# Patient Record
Sex: Female | Born: 1970 | Race: Black or African American | Hispanic: No | Marital: Single | Smoking: Never smoker
Health system: Southern US, Community
[De-identification: ages and names within clinical notes are randomized; demographics above are authoritative.]

## PROBLEM LIST (undated history)

## (undated) DIAGNOSIS — I10 Essential (primary) hypertension: Secondary | ICD-10-CM

## (undated) DIAGNOSIS — R112 Nausea with vomiting, unspecified: Secondary | ICD-10-CM

## (undated) DIAGNOSIS — R55 Syncope and collapse: Principal | ICD-10-CM

## (undated) DIAGNOSIS — E119 Type 2 diabetes mellitus without complications: Secondary | ICD-10-CM

## (undated) DIAGNOSIS — E559 Vitamin D deficiency, unspecified: Secondary | ICD-10-CM

## (undated) DIAGNOSIS — D649 Anemia, unspecified: Secondary | ICD-10-CM

## (undated) DIAGNOSIS — Z9889 Other specified postprocedural states: Secondary | ICD-10-CM

## (undated) DIAGNOSIS — M7989 Other specified soft tissue disorders: Secondary | ICD-10-CM

## (undated) DIAGNOSIS — I493 Ventricular premature depolarization: Secondary | ICD-10-CM

## (undated) HISTORY — PX: TUBAL LIGATION: SHX77

## (undated) HISTORY — DX: Ventricular premature depolarization: I49.3

## (undated) HISTORY — DX: Syncope and collapse: R55

## (undated) HISTORY — DX: Essential (primary) hypertension: I10

## (undated) HISTORY — PX: WISDOM TOOTH EXTRACTION: SHX21

---

## 2002-03-03 ENCOUNTER — Other Ambulatory Visit: Admission: RE | Admit: 2002-03-03 | Discharge: 2002-03-03 | Payer: Self-pay

## 2003-07-04 ENCOUNTER — Other Ambulatory Visit: Admission: RE | Admit: 2003-07-04 | Discharge: 2003-07-04 | Payer: Self-pay | Admitting: Family Medicine

## 2004-11-20 ENCOUNTER — Emergency Department (HOSPITAL_COMMUNITY): Admission: EM | Admit: 2004-11-20 | Discharge: 2004-11-20 | Payer: Self-pay | Admitting: Emergency Medicine

## 2004-12-27 ENCOUNTER — Other Ambulatory Visit: Admission: RE | Admit: 2004-12-27 | Discharge: 2004-12-27 | Payer: Self-pay | Admitting: Obstetrics and Gynecology

## 2006-07-25 ENCOUNTER — Ambulatory Visit (HOSPITAL_COMMUNITY): Admission: RE | Admit: 2006-07-25 | Discharge: 2006-07-25 | Payer: Self-pay | Admitting: Obstetrics and Gynecology

## 2007-05-05 ENCOUNTER — Emergency Department (HOSPITAL_COMMUNITY): Admission: EM | Admit: 2007-05-05 | Discharge: 2007-05-05 | Payer: Self-pay | Admitting: Emergency Medicine

## 2010-08-17 ENCOUNTER — Other Ambulatory Visit (HOSPITAL_COMMUNITY)
Admission: RE | Admit: 2010-08-17 | Discharge: 2010-08-17 | Disposition: A | Discharge status: Home / Self Care | Payer: Managed Care, Other (non HMO) | Source: Ambulatory Visit | Attending: Family Medicine | Admitting: Family Medicine

## 2010-08-17 DIAGNOSIS — Z124 Encounter for screening for malignant neoplasm of cervix: Secondary | ICD-10-CM | POA: Insufficient documentation

## 2010-08-20 ENCOUNTER — Other Ambulatory Visit: Payer: Self-pay | Admitting: Family Medicine

## 2010-10-05 NOTE — H&P (Signed)
NAME:  Amy [name], Amy [name]                 ACCOUNT NO.:  [id number]   MEDICAL RECORD NO.:  [id number]          PATIENT TYPE:  AMB   LOCATION:  SDC                           FACILITY:  [facility]   PHYSICIAN:  Juluis Mire, M.D.   DATE OF BIRTH:  1970/08/05   DATE OF ADMISSION:  07/25/2006  DATE OF DISCHARGE:                              HISTORY & PHYSICAL   HISTORY OF PRESENT ILLNESS:  The patient is a 40 year old gravida 2,  para 1, abortus 1 female who presents for laparoscopic bilateral tubal  ligation.   The patient desires a permanent sterilization.  Potential  irreversibility of sterilization was discussed.  Alternative birth  control were explained.  Failure rate of 1 in 200 is quoted; failures  can be in the form of ectopic pregnancy, requiring further surgical  management.   ALLERGIES:  NO KNOWN DRUG ALLERGIES.   MEDICATIONS:  Include Allegra.   PAST MEDICAL HISTORY:  Usual childhood disease without any significant  sequelae.   PAST SURGICAL HISTORY:  No previous surgical history.   OBSTETRICAL HISTORY:  She has had 1 prior cesarean section and 1  miscarriage.   FAMILY HISTORY:  History of diabetes as well as hypertension.   SOCIAL HISTORY:  No tobacco or alcohol use.   REVIEW OF SYSTEMS:  Noncontributory.   PHYSICAL EXAM:  VITAL SIGNS:  The patient is afebrile with stable vital  signs.  HEENT:  The patient is normocephalic.  Pupils are equal, round and  reactive to light and accommodation.  Extraocular movements were intact.  Sclerae and conjunctivae were clear.  Oropharynx clear.  NECK:  Without thyromegaly.  BREASTS:  No discrete masses.  LUNGS:  Clear.  CARDIOVASCULAR:  Regular rhythm and rate without murmurs or gallops.  ABDOMEN:  Exam is benign.  No mass, organomegaly or tenderness.  PELVIC:  Normal external genitalia.  Vaginal mucosa is clear.  Cervix is  unremarkable.  Uterus is normal size, shape and contour.  Adnexa are  free of masses or tenderness.  EXTREMITIES:  Trace edema.  NEUROLOGIC:  Exam is grossly within normal limits.   IMPRESSION:  1. Multiparity.  2. Desires sterility.   PLAN:  The patient will undergo a laparoscopic bilateral tubal ligation.  The risks of surgery have been discussed including the risk of  infection, the risk of hemorrhage that could require transfusion with  the risk of AIDS or hepatitis, the risk of injury to adjacent organs  including bladder, bowel or ureters that could require further  exploratory surgery, the risks of deep venous thrombosis and pulmonary  emboli.  The patient expressed understanding of indications and risks.      Juluis Mire, M.D.  Electronically Signed     JSM/MEDQ  D:  07/25/2006  T:  07/25/2006  Job:  [id number]

## 2010-10-05 NOTE — Op Note (Signed)
Amy [name], Amy [name]                 ACCOUNT NO.:  [id number]   MEDICAL RECORD NO.:  [id number]          PATIENT TYPE:  AMB   LOCATION:  SDC                           FACILITY:  [facility]   PHYSICIAN:  Juluis Mire, M.D.   DATE OF BIRTH:  12/12/1970   DATE OF PROCEDURE:  07/25/2006  DATE OF DISCHARGE:                               OPERATIVE REPORT   ADMITTING DIAGNOSIS:  Desires sterility.   POSTOPERATIVE DIAGNOSIS:  Desires sterility.   OPERATIVE PROCEDURE:  Open laparoscopy with bilateral tubal fulguration.   SURGEON:  Juluis Mire, M.D.   ANESTHESIA:  General.   ESTIMATED BLOOD LOSS:  Minimal.   PACKS AND DRAINS:  None.   INTRAOPERATIVE BLOOD REPLACEMENT:  None.   COMPLICATIONS:  None.   INDICATIONS:  In dictated history and physical.   PROCEDURE:  The patient was taken to the OR and placed in a supine  position.  After satisfactory level of general endotracheal anesthesia  was obtained, the patient was placed in the dorsal spine position using  the Allen stirrups.  The abdomen, perineum and vagina were prepped out  with Betadine.  The bladder was emptied by in-and-out catheterization.  A Hulka tenaculum was put in place and secured.  The patient was then  draped in sterile field.  A subumbilical incision was made knife.  We  then dissected through the subcutaneous tissue, identified the fascia  and entered it sharply and extended in the fascia laterally.  The  muscles were separated.  The peritoneum was identified and entered  sharply.  A Taut laparoscopic trocar was put in place and secured.  Laparoscope was then introduced.  Abdomen was inflated with carbon  dioxide.  Visualization revealed no evidence of injury to adjacent  organs.  Next, a 5-mm trocar was put in place in the suprapubic area.  The patient was placed in Trendelenburg position.  She was markedly  obese.  We used the probe through the lower trocar for manipulation of  the uterus.  Uterus was of normal  size and shape.  Tubes and ovaries  were unremarkable.  Using the bipolar, a mid-segment of each tube was  identified and coagulated for a distance of 2-3 cm.  Coagulation was  continued until resistance read 0.  We then recoagulated the same  segment of tube.  Coagulation did extend out to the mesosalpinx.  At the  end of the procedure, we had good bilateral coagulation.  At this point  in time, the abdomen was deflated of its carbon dioxide and all trocars  removed.  Subumbilical fascia was closed with 2 figure-of-eights of 0  Vicryl, skin with interrupted subcuticulars of 4-0 Vicryl and suprapubic  incision was closed with Dermabond.  The Hulka tenaculum was then  removed.  The patient was taken out of the dorsal lithotomy position and  once alert and extubated, transferred to recovery room in good  condition.  Sponge, instrument and needle count was reported as correct  by circulating nurse x2.      Juluis Mire, M.D.  Electronically Signed  JSM/MEDQ  D:  07/25/2006  T:  07/25/2006  Job:  [id number]

## 2014-07-11 ENCOUNTER — Emergency Department (HOSPITAL_COMMUNITY)
Admission: EM | Admit: 2014-07-11 | Discharge: 2014-07-11 | Disposition: A | Discharge status: Home / Self Care | Payer: Managed Care, Other (non HMO) | Attending: Emergency Medicine | Admitting: Emergency Medicine

## 2014-07-11 ENCOUNTER — Emergency Department (HOSPITAL_COMMUNITY): Payer: Managed Care, Other (non HMO)

## 2014-07-11 ENCOUNTER — Encounter (HOSPITAL_COMMUNITY): Payer: Self-pay | Admitting: Emergency Medicine

## 2014-07-11 DIAGNOSIS — E119 Type 2 diabetes mellitus without complications: Secondary | ICD-10-CM | POA: Diagnosis not present

## 2014-07-11 DIAGNOSIS — I10 Essential (primary) hypertension: Secondary | ICD-10-CM | POA: Insufficient documentation

## 2014-07-11 DIAGNOSIS — E559 Vitamin D deficiency, unspecified: Secondary | ICD-10-CM | POA: Diagnosis not present

## 2014-07-11 DIAGNOSIS — D649 Anemia, unspecified: Secondary | ICD-10-CM | POA: Diagnosis not present

## 2014-07-11 DIAGNOSIS — R55 Syncope and collapse: Secondary | ICD-10-CM | POA: Diagnosis present

## 2014-07-11 DIAGNOSIS — E663 Overweight: Secondary | ICD-10-CM | POA: Insufficient documentation

## 2014-07-11 DIAGNOSIS — I493 Ventricular premature depolarization: Secondary | ICD-10-CM

## 2014-07-11 DIAGNOSIS — Z79899 Other long term (current) drug therapy: Secondary | ICD-10-CM | POA: Diagnosis not present

## 2014-07-11 HISTORY — DX: Vitamin D deficiency, unspecified: E55.9

## 2014-07-11 HISTORY — DX: Essential (primary) hypertension: I10

## 2014-07-11 HISTORY — DX: Anemia, unspecified: D64.9

## 2014-07-11 HISTORY — DX: Type 2 diabetes mellitus without complications: E11.9

## 2014-07-11 LAB — I-STAT TROPONIN, ED: Troponin i, poc: 0 ng/mL (ref 0.00–0.08)

## 2014-07-11 LAB — COMPREHENSIVE METABOLIC PANEL
ALK PHOS: 63 U/L (ref 39–117)
ALT: 23 U/L (ref 0–35)
AST: 18 U/L (ref 0–37)
Albumin: 3.4 g/dL — ABNORMAL LOW (ref 3.5–5.2)
Anion gap: 8 (ref 5–15)
BUN: 11 mg/dL (ref 6–23)
CALCIUM: 8.5 mg/dL (ref 8.4–10.5)
CHLORIDE: 107 mmol/L (ref 96–112)
CO2: 23 mmol/L (ref 19–32)
Creatinine, Ser: 0.56 mg/dL (ref 0.50–1.10)
GFR calc non Af Amer: >90 mL/min (ref >90)
Glucose, Bld: 90 mg/dL (ref 70–99)
Potassium: 3.9 mmol/L (ref 3.5–5.1)
Sodium: 138 mmol/L (ref 135–145)
Total Bilirubin: 0.5 mg/dL (ref 0.3–1.2)
Total Protein: 7.2 g/dL (ref 6.0–8.3)

## 2014-07-11 LAB — CBC
HEMATOCRIT: 33.8 % — AB (ref 36.0–46.0)
Hemoglobin: 10.2 g/dL — ABNORMAL LOW (ref 12.0–15.0)
MCH: 23.7 pg — ABNORMAL LOW (ref 26.0–34.0)
MCHC: 30.2 g/dL (ref 30.0–36.0)
MCV: 78.6 fL (ref 78.0–100.0)
Platelets: 319 10*3/uL (ref 150–400)
RBC: 4.3 MIL/uL (ref 3.87–5.11)
RDW: 16.8 % — ABNORMAL HIGH (ref 11.5–15.5)
WBC: 4.3 10*3/uL (ref 4.0–10.5)

## 2014-07-11 LAB — D-DIMER, QUANTITATIVE (NOT AT ARMC): D DIMER QUANT: 0.72 ug{FEU}/mL — AB (ref 0.00–0.48)

## 2014-07-11 LAB — CBG MONITORING, ED: Glucose-Capillary: 100 mg/dL — ABNORMAL HIGH (ref 70–99)

## 2014-07-11 MED ORDER — SODIUM CHLORIDE 0.9 % IV SOLN
1000.0000 mL | INTRAVENOUS | Status: DC
  Administered 2014-07-11: 1000 mL via INTRAVENOUS

## 2014-07-11 MED ORDER — ASPIRIN 81 MG PO CHEW
324.0000 mg | CHEWABLE_TABLET | Freq: Once | ORAL | Status: AC
  Administered 2014-07-11: 324 mg via ORAL
  Filled 2014-07-11: qty 4

## 2014-07-11 MED ORDER — IOHEXOL 350 MG/ML SOLN
100.0000 mL | Freq: Once | INTRAVENOUS | Status: AC | PRN
Start: 2014-07-11 — End: 2014-07-11
  Administered 2014-07-11: 100 mL via INTRAVENOUS

## 2014-07-11 NOTE — ED Notes (Signed)
MD at bedside. 

## 2014-07-11 NOTE — ED Notes (Signed)
Pt c/o feeling her "heart flutter" for the past couple days, onset around same time she began taking medications for her cold. Pt states this morning at 0930 and again at 1030 pt felt her "heart flutter" and felt dizziness and "an overwhelming sensation" that she was going to "pass out."

## 2014-07-11 NOTE — ED Notes (Signed)
Provided pt with Amy [name] crackers, PB, and Sprite.

## 2014-07-11 NOTE — ED Notes (Signed)
Patient transported to CT 

## 2014-07-11 NOTE — Discharge Instructions (Signed)
Premature Beats A premature beat is an extra heartbeat that happens earlier than normal. Premature beats are called premature atrial contractions (PACs) or premature ventricular contractions (PVCs) depending on the area of the heart where they start. CAUSES  Premature beats may be brought on by a variety of factors including:  Emotional stress.  Lack of sleep.  Caffeine.  Asthma medicines.  Stimulants.  Herbal teas.  Dietary supplements.  Alcohol. In most cases, premature beats are not dangerous and are not a sign of serious heart disease. Most patients evaluated for premature beats have completely normal heart function. Rarely, premature beats may be a sign of more significant heart problems or medical illness. SYMPTOMS  Premature beats may cause palpitations. This means you feel like your heart is skipping a beat or beating harder than usual. Sometimes, slight chest pain occurs with premature beats, lasting only a few seconds. This pain has been described as a "flopping" feeling inside the chest. In many cases, premature beats do not cause any symptoms and they are only detected when an electrocardiography test (EKG) or heart monitoring is performed. DIAGNOSIS  Your caregiver may run some tests to evaluate your heart such as an EKG or echocardiography. You may need to wear a portable heart monitor for several days to record the electrical activity of your heart. Blood testing may also be performed to check your electrolytes and thyroid function. TREATMENT  Premature beats usually go away with rest. If the problem continues, your caregiver will determine a treatment plan for you.  HOME CARE INSTRUCTIONS  Get plenty of rest over the next few days until your symptoms improve.  Avoid coffee, tea, alcohol, and soda (pop, cola).  Do not smoke. SEEK MEDICAL CARE IF:  Your symptoms continue after 1 to 2 days of rest.  You have new symptoms, such as chest pain or trouble  breathing. SEEK IMMEDIATE MEDICAL CARE IF:  You have severe chest pain or abdominal pain.  You have pain that radiates into the neck, arm, or jaw.  You faint or have extreme weakness.  You have shortness of breath.  Your heartbeat races for more than 5 seconds. MAKE SURE YOU:  Understand these instructions.  Will watch your condition.  Will get help right away if you are not doing well or get worse. Document Released: 06/13/2004 Document Revised: 07/29/2011 Document Reviewed: 01/07/2011 Exit[employer] Patient Information 2015 [employer]. This information is not intended to replace advice given to you by your health care provider. Make sure you discuss any questions you have with your health care provider.  

## 2014-07-11 NOTE — ED Provider Notes (Signed)
CSN: [id number]     Arrival date & time 07/11/14  1108 History   First MD Initiated Contact with Patient 07/11/14 1207     Chief Complaint  Patient presents with  . Near Syncope   HPI Pt had two episodes of her heart racing today. The symptoms lasted for x minutes.  They were separated by about 45 minutes.  She felt like she was going to pass out and had an overwhelming sense that something was wrong.  She continues to have some fluttering while in the ED.  She did have a recent trip to [place] but she did stop once 3 weeks ago.  No history of cardiac disease or PE. Past Medical History  Diagnosis Date  . Diabetes mellitus without complication   . Hypertension   . Anemia   . Vitamin D deficiency    History reviewed. No pertinent past surgical history. History reviewed. No pertinent family history. History  Substance Use Topics  . Smoking status: Never Smoker   . Smokeless tobacco: Not on file  . Alcohol Use: No     Comment: occasionally   OB History    No data available     Review of Systems  All other systems reviewed and are negative.     Allergies  Lisinopril  Home Medications   Prior to Admission medications   Medication Sig Start Date End Date Taking? Authorizing Provider  Cholecalciferol (VITAMIN D PO) Take 1 tablet by mouth daily after breakfast.   Yes Historical Provider, MD  hydrochlorothiazide (MICROZIDE) 12.5 MG capsule Take 12.5 mg by mouth daily as needed (For fluid).   Yes Historical Provider, MD  ibuprofen (ADVIL,MOTRIN) 200 MG tablet Take 600-800 mg by mouth every 6 (six) hours as needed (For menstrual cramping.).   Yes Historical Provider, MD  IRON PO Take 1 tablet by mouth daily after breakfast.   Yes Historical Provider, MD  Multiple Vitamin (MULTIVITAMIN WITH MINERALS) TABS tablet Take 1 tablet by mouth daily after breakfast.   Yes Historical Provider, MD  Phenyleph-Doxylamine-DM-APAP (VICKS DAYQUIL/NYQUIL CLD & FLU PO) Take 2 capsules by mouth every  6 (six) hours as needed (For cold symptoms.).   Yes Historical Provider, MD   BP 148/42 mmHg  Pulse 88  Temp(Src) 98.2 F (36.8 C) (Oral)  Resp 17  SpO2 99%  LMP 07/04/2014 Physical Exam  Constitutional: She appears well-developed and well-nourished. No distress.  Overweight   HENT:  Head: Normocephalic and atraumatic.  Right Ear: External ear normal.  Left Ear: External ear normal.  Eyes: Conjunctivae are normal. Right eye exhibits no discharge. Left eye exhibits no discharge. No scleral icterus.  Neck: Neck supple. No tracheal deviation present.  Cardiovascular: Normal rate, regular rhythm and intact distal pulses.   Pulmonary/Chest: Effort normal and breath sounds normal. No stridor. No respiratory distress. She has no wheezes. She has no rales.  Abdominal: Soft. Bowel sounds are normal. She exhibits no distension. There is no tenderness. There is no rebound and no guarding.  Musculoskeletal: She exhibits no edema or tenderness.  Neurological: She is alert. She has normal strength. No cranial nerve deficit (no facial droop, extraocular movements intact, no slurred speech) or sensory deficit. She exhibits normal muscle tone. She displays no seizure activity. Coordination normal.  Skin: Skin is warm and dry. No rash noted.  Psychiatric: She has a normal mood and affect.  Nursing note and vitals reviewed.   ED Course  Procedures (including critical care time) Labs Review Labs Reviewed  CBG MONITORING, ED - Abnormal; Notable for the following:    Glucose-Capillary 100 (*)    All other components within normal limits    Imaging Review No results found.   EKG Interpretation   Date/Time:  Monday July 11 2014 11:30:38 EST Ventricular Rate:  87 PR Interval:  144 QRS Duration: 84 QT Interval:  365 QTC Calculation: 439 R Axis:   23 Text Interpretation:  Sinus rhythm Low voltage, precordial leads  Borderline T abnormalities, anterior leads Baseline wander in lead(s) II   III aVF No old tracing to compare Confirmed by [facility]  MD, MARTHA [phone])  on 07/11/2014 11:36:44 AM     PVCS noted on the monitor while at the bedside MDM   Final diagnoses:  PVC (premature ventricular contraction)    PVCs noted on the monitor.  NO other abnormal rhythm.  Labs reassuring.  CT without PE. Pt has been taking oTC meds for a cold.  Consider stopping them.  Could be a contributing factor.   At this time there does not appear to be any evidence of an acute emergency medical condition and the patient appears stable for discharge with appropriate outpatient follow up.    Linwood Dibbles, MD 07/11/14 [phone]

## 2014-07-22 ENCOUNTER — Other Ambulatory Visit: Payer: Self-pay | Admitting: Family Medicine

## 2014-07-22 DIAGNOSIS — E010 Iodine-deficiency related diffuse (endemic) goiter: Secondary | ICD-10-CM

## 2014-08-02 ENCOUNTER — Ambulatory Visit
Admission: RE | Admit: 2014-08-02 | Discharge: 2014-08-02 | Disposition: A | Discharge status: Home / Self Care | Payer: Managed Care, Other (non HMO) | Source: Ambulatory Visit | Attending: Family Medicine | Admitting: Family Medicine

## 2014-08-02 DIAGNOSIS — E010 Iodine-deficiency related diffuse (endemic) goiter: Secondary | ICD-10-CM

## 2014-08-08 ENCOUNTER — Other Ambulatory Visit: Payer: Self-pay | Admitting: Family Medicine

## 2014-08-08 ENCOUNTER — Other Ambulatory Visit: Payer: Self-pay | Admitting: Obstetrics and Gynecology

## 2014-08-08 DIAGNOSIS — E041 Nontoxic single thyroid nodule: Secondary | ICD-10-CM

## 2014-08-09 LAB — CYTOLOGY - PAP

## 2014-08-10 ENCOUNTER — Other Ambulatory Visit (HOSPITAL_COMMUNITY)
Admission: RE | Admit: 2014-08-10 | Discharge: 2014-08-10 | Disposition: A | Discharge status: Home / Self Care | Payer: Managed Care, Other (non HMO) | Source: Ambulatory Visit | Attending: Interventional Radiology | Admitting: Interventional Radiology

## 2014-08-10 ENCOUNTER — Ambulatory Visit
Admission: RE | Admit: 2014-08-10 | Discharge: 2014-08-10 | Disposition: A | Discharge status: Home / Self Care | Payer: Managed Care, Other (non HMO) | Source: Ambulatory Visit | Attending: Family Medicine | Admitting: Family Medicine

## 2014-08-10 DIAGNOSIS — E041 Nontoxic single thyroid nodule: Secondary | ICD-10-CM | POA: Insufficient documentation

## 2014-08-30 ENCOUNTER — Ambulatory Visit: Payer: Managed Care, Other (non HMO) | Admitting: Interventional Cardiology

## 2015-03-15 NOTE — H&P (Signed)
  Patient name  Amy [name], [name] DICTATION# [id number] CSN# [id number]  Amy [name],Amy Selvy S, MD 03/15/2015 4:08 PM

## 2015-03-16 NOTE — H&P (Unsigned)
NAME:  Amy [name], [name]                 ACCOUNT NO.:  [id number]  MEDICAL RECORD NO.:  [id number]  LOCATION:                                 FACILITY:  PHYSICIAN:  Amy MireJohn S. Kadon Andrus, M.D.        DATE OF BIRTH:  DATE OF ADMISSION: DATE OF DISCHARGE:                             HISTORY & PHYSICAL   HISTORY OF PRESENT ILLNESS:  The patient is a 44 year old, gravida 2, para 1 female who is having trouble with worsening pelvic pain and menorrhagia.  She has undergone a saline infusion ultrasound that was highly suggestive of adenomyosis.  We went over various options including hormonal suppression with birth control pills, Depo-Provera, or the IUD.  We also discussed endometrial ablation.  She is more in favor of proceeding with definitive surgery, admitted now for laparoscopic-assisted vaginal hysterectomy with removal of both fallopian tubes.  ALLERGIES:  In terms of allergies, she is allergic to LISINOPRIL.  MEDICATIONS:  She is on vitamin D, multivitamin, iron, and hydrochlorothiazide.  PAST MEDICAL HISTORY:  Does have a history of hypertension, otherwise usual childhood diseases.  SURGERIES:  She has had a cesarean section and laparoscopic bilateral tubal ligation.  SOCIAL HISTORY:  Reveals occasional alcohol, but no tobacco use.  FAMILY HISTORY:  Noncontributory.  REVIEW OF SYSTEMS:  Noncontributory.  PHYSICAL EXAMINATION:  VITAL SIGNS:  The patient is afebrile.  Stable vital signs. HEENT:  The patient is normocephalic.  Pupils equal, round, and reactive to light and accommodation.  Extraocular movements were intact.  Sclerae and conjunctivae were clear.  Oropharynx clear. LUNGS:  Clear. CARDIOVASCULAR SYSTEM:  Regular rhythm and rate.  There are no murmurs or gallops. ABDOMEN:  Benign.  Well-healed low-transverse incision. PELVIC:  Normal external genitalia.  Vaginal mucosa is clear.  Cervix unremarkable.  Uterus normal size, shape, and contour.  Adnexa free of mass or  tenderness. EXTREMITIES:  Trace edema.  NEUROLOGIC:  Grossly within normal limits.  IMPRESSION: 1. Menorrhagia, dysmenorrhea secondary to adenomyosis. 2. Hypertension.  PLAN:  The patient to undergo laparoscopic-assisted vaginal hysterectomy with removal of both fallopian tubes.  The risks of surgery have been discussed including the risk of infection.  The risk of hemorrhage that could require transfusion with the risk of AIDS, hepatitis, risk of injury to adjacent organs including bladder, bowel, ureters that could require further exploratory surgery, risk of deep venous thrombosis and pulmonary embolus.  The patient does understand the indications, risks, and alternatives.     Amy MireJohn S. Amy [name], M.D.     JSM/MEDQ  D:  03/15/2015  T:  03/16/2015  Job:  [id number]

## 2015-03-21 ENCOUNTER — Encounter (HOSPITAL_COMMUNITY)
Admission: RE | Admit: 2015-03-21 | Discharge: 2015-03-21 | Disposition: A | Discharge status: Home / Self Care | Payer: Managed Care, Other (non HMO) | Source: Ambulatory Visit | Attending: Obstetrics and Gynecology | Admitting: Obstetrics and Gynecology

## 2015-03-21 ENCOUNTER — Encounter (HOSPITAL_COMMUNITY): Payer: Self-pay

## 2015-03-21 DIAGNOSIS — N800 Endometriosis of uterus: Secondary | ICD-10-CM | POA: Insufficient documentation

## 2015-03-21 DIAGNOSIS — Z01818 Encounter for other preprocedural examination: Secondary | ICD-10-CM | POA: Diagnosis not present

## 2015-03-21 HISTORY — DX: Nausea with vomiting, unspecified: R11.2

## 2015-03-21 HISTORY — DX: Other specified soft tissue disorders: M79.89

## 2015-03-21 HISTORY — DX: Other specified postprocedural states: Z98.890

## 2015-03-21 LAB — CBC
HCT: 34.5 % — ABNORMAL LOW (ref 36.0–46.0)
Hemoglobin: 10.8 g/dL — ABNORMAL LOW (ref 12.0–15.0)
MCH: 25.5 pg — AB (ref 26.0–34.0)
MCHC: 31.3 g/dL (ref 30.0–36.0)
MCV: 81.6 fL (ref 78.0–100.0)
PLATELETS: 285 10*3/uL (ref 150–400)
RBC: 4.23 MIL/uL (ref 3.87–5.11)
RDW: 15.1 % (ref 11.5–15.5)
WBC: 4.3 10*3/uL (ref 4.0–10.5)

## 2015-03-21 LAB — BASIC METABOLIC PANEL
ANION GAP: 4 — AB (ref 5–15)
BUN: 8 mg/dL (ref 6–20)
CHLORIDE: 109 mmol/L (ref 101–111)
CO2: 24 mmol/L (ref 22–32)
CREATININE: 0.67 mg/dL (ref 0.44–1.00)
Calcium: 8.5 mg/dL — ABNORMAL LOW (ref 8.9–10.3)
GFR calc non Af Amer: >60 mL/min (ref >60)
Glucose, Bld: 106 mg/dL — ABNORMAL HIGH (ref 65–99)
POTASSIUM: 3.7 mmol/L (ref 3.5–5.1)
SODIUM: 137 mmol/L (ref 135–145)

## 2015-03-21 NOTE — Patient Instructions (Addendum)
Your procedure is scheduled on:  Monday, NOV. 7, 2016  [facility] through [facility] at:  6:00 A.M.  Pick up the phone at the desk and dial [phone].  Call this number if you have problems the morning of surgery: [phone].  Remember: Do NOT eat food or drink after:  MIDNIGHT SUNDAY Take these medicines the morning of surgery with a SIP OF WATER:  HYDROCHLOROTHIAZIDE  Do NOT wear jewelry (body piercing), metal hair clips/bobby pins, make-up, or nail polish. Do NOT wear lotions, powders, or perfumes.  You may wear deoderant. Do NOT shave for 48 hours prior to surgery. Do NOT bring valuables to the hospital. Contacts, dentures, or bridgework may not be worn into surgery. Leave suitcase in car.  After surgery it may be brought to your room.  For patients admitted to the hospital, checkout time is 11:00 AM the day of discharge.

## 2015-03-26 MED ORDER — DEXTROSE 5 % IV SOLN
3.0000 g | INTRAVENOUS | Status: AC
  Administered 2015-03-27: 3 g via INTRAVENOUS
  Filled 2015-03-26: qty 3000

## 2015-03-26 NOTE — Anesthesia Preprocedure Evaluation (Addendum)
Anesthesia Evaluation  Patient identified by MRN, date of birth, ID band Patient awake    Reviewed: Allergy & Precautions, H&P , NPO status , Patient's Chart, lab work & pertinent test results  History of Anesthesia Complications (+) PONV and history of anesthetic complications  Airway Mallampati: I  TM Distance: >3 FB Neck ROM: full    Dental no notable dental hx.    Pulmonary neg pulmonary ROS,    Pulmonary exam normal breath sounds clear to auscultation       Cardiovascular hypertension, Pt. on medications Normal cardiovascular exam Rhythm:regular Rate:Normal     Neuro/Psych negative neurological ROS     GI/Hepatic negative GI ROS, Neg liver ROS,   Endo/Other  diabetes, Type obesity  Renal/GU negative Renal ROS     Musculoskeletal   Abdominal (+) + obese,   Peds  Hematology  (+) anemia ,   Anesthesia Other Findings   Reproductive/Obstetrics negative OB ROS                          Anesthesia Physical Anesthesia Plan  ASA: III  Anesthesia Plan: General   Post-op Pain Management:    Induction: Intravenous  Airway Management Planned: Oral ETT  Additional Equipment:   Intra-op Plan:   Post-operative Plan: Extubation in OR  Informed Consent: I have reviewed the patients History and Physical, chart, labs and discussed the procedure including the risks, benefits and alternatives for the proposed anesthesia with the patient or authorized representative who has indicated his/her understanding and acceptance.   Dental Advisory Given  Plan Discussed with: Anesthesiologist, CRNA and Surgeon  Anesthesia Plan Comments: (GA with ETT, ramp position for intubation given morbid obesity Scop patch, decadron, zofran for PONV ppx Multimodal analgesia)        Anesthesia Quick Evaluation

## 2015-03-27 ENCOUNTER — Inpatient Hospital Stay (HOSPITAL_COMMUNITY): Payer: Managed Care, Other (non HMO) | Admitting: Anesthesiology

## 2015-03-27 ENCOUNTER — Inpatient Hospital Stay (HOSPITAL_COMMUNITY)
Admission: AD | Admit: 2015-03-27 | Discharge: 2015-03-29 | DRG: 742 | Disposition: A | Discharge status: Home / Self Care | Payer: Managed Care, Other (non HMO) | Source: Ambulatory Visit | Attending: Obstetrics and Gynecology | Admitting: Obstetrics and Gynecology

## 2015-03-27 ENCOUNTER — Encounter (HOSPITAL_COMMUNITY): Payer: Self-pay

## 2015-03-27 ENCOUNTER — Encounter (HOSPITAL_COMMUNITY)
Admission: AD | Disposition: A | Discharge status: Home / Self Care | Payer: Self-pay | Source: Ambulatory Visit | Attending: Obstetrics and Gynecology

## 2015-03-27 DIAGNOSIS — Z6841 Body Mass Index (BMI) 40.0 and over, adult: Secondary | ICD-10-CM

## 2015-03-27 DIAGNOSIS — N946 Dysmenorrhea, unspecified: Secondary | ICD-10-CM | POA: Diagnosis present

## 2015-03-27 DIAGNOSIS — N800 Endometriosis of uterus: Principal | ICD-10-CM | POA: Diagnosis present

## 2015-03-27 DIAGNOSIS — I10 Essential (primary) hypertension: Secondary | ICD-10-CM | POA: Diagnosis present

## 2015-03-27 DIAGNOSIS — N920 Excessive and frequent menstruation with regular cycle: Secondary | ICD-10-CM | POA: Diagnosis present

## 2015-03-27 DIAGNOSIS — R102 Pelvic and perineal pain: Secondary | ICD-10-CM | POA: Diagnosis present

## 2015-03-27 DIAGNOSIS — Z90710 Acquired absence of both cervix and uterus: Secondary | ICD-10-CM

## 2015-03-27 HISTORY — PX: LAPAROSCOPIC VAGINAL HYSTERECTOMY WITH SALPINGECTOMY: SHX6680

## 2015-03-27 HISTORY — PX: ABDOMINAL HYSTERECTOMY: SHX81

## 2015-03-27 LAB — TYPE AND SCREEN
ABO/RH(D): A POS
Antibody Screen: NEGATIVE

## 2015-03-27 LAB — GLUCOSE, CAPILLARY: Glucose-Capillary: 126 mg/dL — ABNORMAL HIGH (ref 65–99)

## 2015-03-27 LAB — ABO/RH: ABO/RH(D): A POS

## 2015-03-27 LAB — HCG, SERUM, QUALITATIVE: PREG SERUM: NEGATIVE

## 2015-03-27 SURGERY — HYSTERECTOMY, VAGINAL, LAPAROSCOPY-ASSISTED, WITH SALPINGECTOMY
Anesthesia: General | Site: Abdomen | Laterality: Bilateral

## 2015-03-27 MED ORDER — MENTHOL 3 MG MT LOZG: 1.0000 | LOZENGE | OROMUCOSAL | Status: DC | PRN

## 2015-03-27 MED ORDER — DIPHENHYDRAMINE HCL 12.5 MG/5ML PO ELIX: 12.5000 mg | ORAL_SOLUTION | Freq: Four times a day (QID) | ORAL | Status: DC | PRN

## 2015-03-27 MED ORDER — LACTATED RINGERS IV SOLN
INTRAVENOUS | Status: DC
  Administered 2015-03-27 (×2): via INTRAVENOUS
  Administered 2015-03-27: 125 mL/h via INTRAVENOUS

## 2015-03-27 MED ORDER — PROPOFOL 10 MG/ML IV BOLUS
INTRAVENOUS | Status: DC | PRN
  Administered 2015-03-27: 50 mg via INTRAVENOUS
  Administered 2015-03-27: 200 mg via INTRAVENOUS

## 2015-03-27 MED ORDER — KETOROLAC TROMETHAMINE 30 MG/ML IJ SOLN
INTRAMUSCULAR | Status: AC
  Filled 2015-03-27: qty 1

## 2015-03-27 MED ORDER — PROPOFOL 10 MG/ML IV BOLUS
INTRAVENOUS | Status: AC
  Filled 2015-03-27: qty 20

## 2015-03-27 MED ORDER — LIDOCAINE HCL (CARDIAC) 20 MG/ML IV SOLN
INTRAVENOUS | Status: DC | PRN
  Administered 2015-03-27: 100 mg via INTRAVENOUS

## 2015-03-27 MED ORDER — HYDROMORPHONE HCL 1 MG/ML IJ SOLN
INTRAMUSCULAR | Status: AC
  Filled 2015-03-27: qty 1

## 2015-03-27 MED ORDER — SODIUM CHLORIDE 0.9 % IJ SOLN: 9.0000 mL | INTRAMUSCULAR | Status: DC | PRN

## 2015-03-27 MED ORDER — BUPIVACAINE HCL (PF) 0.25 % IJ SOLN
INTRAMUSCULAR | Status: AC
  Filled 2015-03-27: qty 30

## 2015-03-27 MED ORDER — ONDANSETRON HCL 4 MG/2ML IJ SOLN: 4.0000 mg | Freq: Four times a day (QID) | INTRAMUSCULAR | Status: DC | PRN

## 2015-03-27 MED ORDER — HYDROMORPHONE 1 MG/ML IV SOLN
INTRAVENOUS | Status: DC
  Administered 2015-03-27: 3.8 mg via INTRAVENOUS
  Administered 2015-03-27: 1.4 mg via INTRAVENOUS
  Administered 2015-03-27: 2.8 mg via INTRAVENOUS
  Administered 2015-03-27: 12:00:00 via INTRAVENOUS
  Administered 2015-03-28: 1.2 mg via INTRAVENOUS
  Administered 2015-03-28: 1.8 mg via INTRAVENOUS
  Filled 2015-03-27: qty 25

## 2015-03-27 MED ORDER — NEOSTIGMINE METHYLSULFATE 10 MG/10ML IV SOLN
INTRAVENOUS | Status: DC | PRN
  Administered 2015-03-27 (×2): 1.5 mg via INTRAVENOUS

## 2015-03-27 MED ORDER — HYDROMORPHONE HCL 1 MG/ML IJ SOLN
0.5000 mg | Freq: Once | INTRAMUSCULAR | Status: AC
  Administered 2015-03-27: 0.5 mg via INTRAVENOUS

## 2015-03-27 MED ORDER — ROCURONIUM BROMIDE 100 MG/10ML IV SOLN
INTRAVENOUS | Status: DC | PRN
Start: 2015-03-27 — End: 2015-03-27
  Administered 2015-03-27: 10 mg via INTRAVENOUS
  Administered 2015-03-27: 5 mg via INTRAVENOUS
  Administered 2015-03-27: 20 mg via INTRAVENOUS
  Administered 2015-03-27: 10 mg via INTRAVENOUS

## 2015-03-27 MED ORDER — ONDANSETRON HCL 4 MG/2ML IJ SOLN
INTRAMUSCULAR | Status: AC
Start: 2015-03-27 — End: 2015-03-27
  Filled 2015-03-27: qty 2

## 2015-03-27 MED ORDER — GLYCOPYRROLATE 0.2 MG/ML IJ SOLN
INTRAMUSCULAR | Status: DC | PRN
  Administered 2015-03-27 (×2): 0.3 mg via INTRAVENOUS

## 2015-03-27 MED ORDER — ROCURONIUM BROMIDE 100 MG/10ML IV SOLN
INTRAVENOUS | Status: AC
  Filled 2015-03-27: qty 1

## 2015-03-27 MED ORDER — ONDANSETRON HCL 4 MG/2ML IJ SOLN
INTRAMUSCULAR | Status: DC | PRN
  Administered 2015-03-27: 4 mg via INTRAVENOUS

## 2015-03-27 MED ORDER — GLYCOPYRROLATE 0.2 MG/ML IJ SOLN
INTRAMUSCULAR | Status: AC
  Filled 2015-03-27: qty 3

## 2015-03-27 MED ORDER — OXYCODONE HCL 5 MG PO TABS: 5.0000 mg | ORAL_TABLET | Freq: Once | ORAL | Status: DC | PRN

## 2015-03-27 MED ORDER — HYDROCHLOROTHIAZIDE 12.5 MG PO CAPS
12.5000 mg | ORAL_CAPSULE | Freq: Every day | ORAL | Status: DC | PRN
  Filled 2015-03-27: qty 1

## 2015-03-27 MED ORDER — MIDAZOLAM HCL 2 MG/2ML IJ SOLN
INTRAMUSCULAR | Status: DC | PRN
  Administered 2015-03-27: 2 mg via INTRAVENOUS

## 2015-03-27 MED ORDER — ONDANSETRON HCL 4 MG/2ML IJ SOLN
4.0000 mg | Freq: Four times a day (QID) | INTRAMUSCULAR | Status: DC | PRN
Start: 2015-03-27 — End: 2015-03-29

## 2015-03-27 MED ORDER — PROMETHAZINE HCL 25 MG/ML IJ SOLN
INTRAMUSCULAR | Status: AC
  Filled 2015-03-27: qty 1

## 2015-03-27 MED ORDER — KETOROLAC TROMETHAMINE 30 MG/ML IJ SOLN
INTRAMUSCULAR | Status: DC | PRN
  Administered 2015-03-27: 30 mg via INTRAVENOUS

## 2015-03-27 MED ORDER — SUCCINYLCHOLINE CHLORIDE 20 MG/ML IJ SOLN
INTRAMUSCULAR | Status: AC
  Filled 2015-03-27: qty 1

## 2015-03-27 MED ORDER — SCOPOLAMINE 1 MG/3DAYS TD PT72
MEDICATED_PATCH | TRANSDERMAL | Status: AC
  Administered 2015-03-27: 1.5 mg via TRANSDERMAL
  Filled 2015-03-27: qty 1

## 2015-03-27 MED ORDER — SUCCINYLCHOLINE CHLORIDE 200 MG/10ML IV SOSY
PREFILLED_SYRINGE | INTRAVENOUS | Status: DC | PRN
  Administered 2015-03-27: 140 mg via INTRAVENOUS

## 2015-03-27 MED ORDER — FENTANYL CITRATE (PF) 250 MCG/5ML IJ SOLN
INTRAMUSCULAR | Status: AC
  Filled 2015-03-27: qty 25

## 2015-03-27 MED ORDER — SODIUM CHLORIDE BACTERIOSTATIC 0.9 % IJ SOLN
INTRAMUSCULAR | Status: DC | PRN
  Administered 2015-03-27: 20 mL

## 2015-03-27 MED ORDER — BUPIVACAINE HCL (PF) 0.25 % IJ SOLN
INTRAMUSCULAR | Status: DC | PRN
  Administered 2015-03-27: 1 mL

## 2015-03-27 MED ORDER — BUPIVACAINE LIPOSOME 1.3 % IJ SUSP
20.0000 mL | Freq: Once | INTRAMUSCULAR | Status: AC
  Administered 2015-03-27: 20 mL
  Filled 2015-03-27: qty 20

## 2015-03-27 MED ORDER — NALOXONE HCL 0.4 MG/ML IJ SOLN: 0.4000 mg | INTRAMUSCULAR | Status: DC | PRN

## 2015-03-27 MED ORDER — OXYCODONE-ACETAMINOPHEN 5-325 MG PO TABS
1.0000 | ORAL_TABLET | ORAL | Status: DC | PRN
  Administered 2015-03-28 (×2): 2 via ORAL
  Administered 2015-03-28: 1 via ORAL
  Administered 2015-03-28 – 2015-03-29 (×3): 2 via ORAL
  Filled 2015-03-27 (×2): qty 2
  Filled 2015-03-27: qty 1
  Filled 2015-03-27 (×3): qty 2

## 2015-03-27 MED ORDER — FENTANYL CITRATE (PF) 100 MCG/2ML IJ SOLN
INTRAMUSCULAR | Status: AC
  Filled 2015-03-27: qty 4

## 2015-03-27 MED ORDER — DIPHENHYDRAMINE HCL 50 MG/ML IJ SOLN: 12.5000 mg | Freq: Four times a day (QID) | INTRAMUSCULAR | Status: DC | PRN

## 2015-03-27 MED ORDER — MIDAZOLAM HCL 2 MG/2ML IJ SOLN
INTRAMUSCULAR | Status: AC
  Filled 2015-03-27: qty 4

## 2015-03-27 MED ORDER — ACETAMINOPHEN 325 MG PO TABS: 650.0000 mg | ORAL_TABLET | ORAL | Status: DC | PRN

## 2015-03-27 MED ORDER — NEOSTIGMINE METHYLSULFATE 10 MG/10ML IV SOLN
INTRAVENOUS | Status: AC
  Filled 2015-03-27: qty 1

## 2015-03-27 MED ORDER — LACTATED RINGERS IV SOLN
INTRAVENOUS | Status: DC
  Administered 2015-03-27 (×2): via INTRAVENOUS

## 2015-03-27 MED ORDER — FENTANYL CITRATE (PF) 100 MCG/2ML IJ SOLN
INTRAMUSCULAR | Status: DC | PRN
  Administered 2015-03-27: 100 ug via INTRAVENOUS
  Administered 2015-03-27 (×3): 25 ug via INTRAVENOUS
  Administered 2015-03-27: 100 ug via INTRAVENOUS
  Administered 2015-03-27 (×2): 50 ug via INTRAVENOUS
  Administered 2015-03-27: 100 ug via INTRAVENOUS
  Administered 2015-03-27: 25 ug via INTRAVENOUS

## 2015-03-27 MED ORDER — LACTATED RINGERS IR SOLN
Status: DC | PRN
  Administered 2015-03-27: 3000 mL

## 2015-03-27 MED ORDER — SCOPOLAMINE 1 MG/3DAYS TD PT72
1.0000 | MEDICATED_PATCH | Freq: Once | TRANSDERMAL | Status: DC
  Administered 2015-03-27: 1.5 mg via TRANSDERMAL

## 2015-03-27 MED ORDER — ONDANSETRON HCL 4 MG PO TABS: 4.0000 mg | ORAL_TABLET | Freq: Four times a day (QID) | ORAL | Status: DC | PRN

## 2015-03-27 MED ORDER — LIDOCAINE HCL (CARDIAC) 20 MG/ML IV SOLN
INTRAVENOUS | Status: AC
Start: 2015-03-27 — End: 2015-03-27
  Filled 2015-03-27: qty 5

## 2015-03-27 MED ORDER — PROMETHAZINE HCL 25 MG/ML IJ SOLN
6.2500 mg | INTRAMUSCULAR | Status: AC | PRN
  Administered 2015-03-27 (×2): 6.25 mg via INTRAVENOUS

## 2015-03-27 MED ORDER — OXYCODONE HCL 5 MG/5ML PO SOLN: 5.0000 mg | Freq: Once | ORAL | Status: DC | PRN

## 2015-03-27 MED ORDER — SODIUM CHLORIDE 0.9 % IJ SOLN
INTRAMUSCULAR | Status: AC
  Filled 2015-03-27: qty 20

## 2015-03-27 MED ORDER — HYDROMORPHONE HCL 1 MG/ML IJ SOLN
0.2500 mg | INTRAMUSCULAR | Status: DC | PRN
  Administered 2015-03-27 (×4): 0.5 mg via INTRAVENOUS

## 2015-03-27 SURGICAL SUPPLY — 52 items
CABLE HIGH FREQUENCY MONO STRZ (ELECTRODE) ×2 IMPLANT
CATH ROBINSON RED A/P 16FR (CATHETERS) ×3 IMPLANT
CLOSURE WOUND 1/4 X3 (GAUZE/BANDAGES/DRESSINGS)
CLOTH BEACON ORANGE TIMEOUT ST (SAFETY) ×3 IMPLANT
CONT PATH 16OZ SNAP LID 3702 (MISCELLANEOUS) ×3 IMPLANT
COVER BACK TABLE 60X90IN (DRAPES) ×3 IMPLANT
DECANTER SPIKE VIAL GLASS SM (MISCELLANEOUS) ×2 IMPLANT
DRSG COVADERM PLUS 2X2 (GAUZE/BANDAGES/DRESSINGS) ×6 IMPLANT
DRSG OPSITE POSTOP 3X4 (GAUZE/BANDAGES/DRESSINGS) IMPLANT
DRSG OPSITE POSTOP 4X10 (GAUZE/BANDAGES/DRESSINGS) ×2 IMPLANT
DURAPREP 26ML APPLICATOR (WOUND CARE) ×3 IMPLANT
ELECT REM PT RETURN 9FT ADLT (ELECTROSURGICAL) ×3
ELECTRODE REM PT RTRN 9FT ADLT (ELECTROSURGICAL) IMPLANT
EVACUATOR SMOKE 8.L (FILTER) IMPLANT
GLOVE BIO SURGEON STRL SZ7 (GLOVE) ×6 IMPLANT
GLOVE BIOGEL PI IND STRL 6.5 (GLOVE) ×1 IMPLANT
GLOVE BIOGEL PI IND STRL 7.0 (GLOVE) ×2 IMPLANT
GLOVE BIOGEL PI INDICATOR 6.5 (GLOVE) ×2
GLOVE BIOGEL PI INDICATOR 7.0 (GLOVE) ×4
LEGGING LITHOTOMY PAIR STRL (DRAPES) ×3 IMPLANT
LIQUID BAND (GAUZE/BANDAGES/DRESSINGS) ×3 IMPLANT
NEEDLE INSUFFLATION 120MM (ENDOMECHANICALS) ×2 IMPLANT
NS IRRIG 1000ML POUR BTL (IV SOLUTION) ×3 IMPLANT
PACK LAVH (CUSTOM PROCEDURE TRAY) ×3 IMPLANT
PACK ROBOTIC GOWN (GOWN DISPOSABLE) ×3 IMPLANT
PAD POSITIONING PINK XL (MISCELLANEOUS) ×3 IMPLANT
SEALER TISSUE G2 CVD JAW 45CM (ENDOMECHANICALS) ×3 IMPLANT
SET CYSTO W/LG BORE CLAMP LF (SET/KITS/TRAYS/PACK) IMPLANT
SET IRRIG TUBING LAPAROSCOPIC (IRRIGATION / IRRIGATOR) ×2 IMPLANT
SPONGE LAP 18X18 X RAY DECT (DISPOSABLE) ×6 IMPLANT
STRIP CLOSURE SKIN 1/4X3 (GAUZE/BANDAGES/DRESSINGS) IMPLANT
SUT MON AB 2-0 CT1 27 (SUTURE) ×6 IMPLANT
SUT MON AB 4-0 PS1 27 (SUTURE) ×2 IMPLANT
SUT PDS AB 0 CT 36 (SUTURE) ×2 IMPLANT
SUT PROLENE 6 0 C 1 24 (SUTURE) ×2 IMPLANT
SUT VIC AB 0 CT1 18XCR BRD8 (SUTURE) ×2 IMPLANT
SUT VIC AB 0 CT1 27 (SUTURE) ×3
SUT VIC AB 0 CT1 27XBRD ANBCTR (SUTURE) ×1 IMPLANT
SUT VIC AB 0 CT1 36 (SUTURE) ×3 IMPLANT
SUT VIC AB 0 CT1 8-18 (SUTURE) ×6
SUT VIC AB 2-0 CT1 (SUTURE) ×2 IMPLANT
SUT VICRYL 0 TIES 12 18 (SUTURE) ×2 IMPLANT
SUT VICRYL 0 UR6 27IN ABS (SUTURE) IMPLANT
SUT VICRYL 1 TIES 12X18 (SUTURE) ×3 IMPLANT
SUT VICRYL 4-0 PS2 18IN ABS (SUTURE) ×3 IMPLANT
TOWEL OR 17X24 6PK STRL BLUE (TOWEL DISPOSABLE) ×6 IMPLANT
TRAY FOLEY CATH SILVER 14FR (SET/KITS/TRAYS/PACK) ×3 IMPLANT
TROCAR BALLN 12MMX100 BLUNT (TROCAR) ×2 IMPLANT
TROCAR OPTI TIP 5M 100M (ENDOMECHANICALS) ×3 IMPLANT
TROCAR XCEL DIL TIP R 11M (ENDOMECHANICALS) IMPLANT
WARMER LAPAROSCOPE (MISCELLANEOUS) ×3 IMPLANT
WATER STERILE IRR 1000ML POUR (IV SOLUTION) ×3 IMPLANT

## 2015-03-27 NOTE — H&P (Signed)
  History and physical exam unchanged 

## 2015-03-27 NOTE — Progress Notes (Signed)
Patient ID: Hoyle BarrLeslie A Angert, female   DOB: 06/14/1970, 44 y.o.   MRN: [id number] Doing wekk Af vss Abd soft Dressing dry  Good uo

## 2015-03-27 NOTE — Transfer of Care (Addendum)
Immediate Anesthesia Transfer of Care Note  Patient: Amy [name]  Procedure(s) Performed: Procedure(s): ATTEMPTED LAPAROSCOPIC ASSISTED VAGINAL HYSTERECTOMY WITH BILATERAL SALPINGECTOMY (Bilateral) HYSTERECTOMY ABDOMINAL WITH BILATERAL SALPINGECTOMY (Bilateral)  Patient Location: PACU  Anesthesia Type:General  Level of Consciousness: awake  Airway & Oxygen Therapy: Patient Spontanous Breathing  Post-op Assessment: Report given to PACU RN  Post vital signs: stable  Filed Vitals:   03/27/15 0936  BP:   Pulse:   Temp: 36.7 C  Resp:     Complications: No apparent anesthesia complications

## 2015-03-27 NOTE — Brief Op Note (Signed)
03/27/2015  9:35 AM  PATIENT:  Amy [name]  44 y.o. female  PRE-OPERATIVE DIAGNOSIS:  ADENOMYOSIS, ENLGARGED UTERUS  POST-OPERATIVE DIAGNOSIS:  Adenomyosis, enlarged uterus  PROCEDURE:  Procedure(s): ATTEMPTED LAPAROSCOPIC ASSISTED VAGINAL HYSTERECTOMY WITH BILATERAL SALPINGECTOMY (Bilateral) HYSTERECTOMY ABDOMINAL WITH BILATERAL SALPINGECTOMY (Bilateral)  SURGEON:  Surgeon(s) and Role:    * Richardean ChimeraJohn Tamura Lasky, MD - Primary  PHYSICIAN ASSISTANT:   ASSISTANTS: morris   ANESTHESIA:   general  EBL:  Total I/O In: 2000 [I.V.:2000] Out: 380 [Urine:180; Blood:200]  BLOOD ADMINISTERED:none  DRAINS: Urinary Catheter (Foley)   LOCAL MEDICATIONS USED:  MARCAINE     SPECIMEN:  Source of Specimen:  uterus and both fallopian tubes  DISPOSITION OF SPECIMEN:  PATHOLOGY  COUNTS:  YES  TOURNIQUET:  * No tourniquets in log *  DICTATION: .Other Dictation: Dictation Number [id number]  PLAN OF CARE: Admit for overnight observation  PATIENT DISPOSITION:  PACU - hemodynamically stable.   Delay start of Pharmacological VTE agent (>24hrs) due to surgical blood loss or risk of bleeding: no

## 2015-03-27 NOTE — Addendum Note (Signed)
Addendum  created 03/27/15 1411 by Shanon PayorSuzanne M Govani Radloff, CRNA   Modules edited: Notes Section   Notes Section:  File: [id number]

## 2015-03-27 NOTE — Anesthesia Postprocedure Evaluation (Signed)
  Anesthesia Post-op Note  Patient: Amy [name]  Procedure(s) Performed: Procedure(s) (LRB): ATTEMPTED LAPAROSCOPIC ASSISTED VAGINAL HYSTERECTOMY WITH BILATERAL SALPINGECTOMY (Bilateral) HYSTERECTOMY ABDOMINAL WITH BILATERAL SALPINGECTOMY (Bilateral)  Patient Location: PACU  Anesthesia Type: General  Level of Consciousness: awake and alert   Airway and Oxygen Therapy: Patient Spontanous Breathing  Post-op Pain: mild  Post-op Assessment: Post-op Vital signs reviewed, Patient's Cardiovascular Status Stable, Respiratory Function Stable, Patent Airway and No signs of Nausea or vomiting  Last Vitals:  Filed Vitals:   03/27/15 1015  BP: 164/87  Pulse: 81  Temp:   Resp: 13    Post-op Vital Signs: stable   Complications: No apparent anesthesia complications

## 2015-03-27 NOTE — Anesthesia Procedure Notes (Signed)
Procedure Name: Intubation Date/Time: 03/27/2015 7:25 AM Performed by: Cephus ShellingBURGER, Philander Ake A Pre-anesthesia Checklist: Patient identified, Emergency Drugs available, Suction available and Patient being monitored Patient Re-evaluated:Patient Re-evaluated prior to inductionOxygen Delivery Method: Circle system utilized Preoxygenation: Pre-oxygenation with 100% oxygen Intubation Type: IV induction, Combination inhalational/ intravenous induction, Rapid sequence and Cricoid Pressure applied Ventilation: Mask ventilation without difficulty Laryngoscope Size: Mac and 3 Grade View: Grade I Tube type: Oral Tube size: 7.0 mm Number of attempts: 1 Airway Equipment and Method: Patient positioned with wedge pillow,  Stylet and Bite block Placement Confirmation: ETT inserted through vocal cords under direct vision,  positive ETCO2 and breath sounds checked- equal and bilateral Secured at: 20 cm Tube secured with: Tape Dental Injury: Teeth and Oropharynx as per pre-operative assessment

## 2015-03-27 NOTE — Anesthesia Postprocedure Evaluation (Signed)
  Anesthesia Post-op Note  Patient: Hoyle BarrLeslie A Burruss  Procedure(s) Performed: Procedure(s): ATTEMPTED LAPAROSCOPIC ASSISTED VAGINAL HYSTERECTOMY WITH BILATERAL SALPINGECTOMY (Bilateral) HYSTERECTOMY ABDOMINAL WITH BILATERAL SALPINGECTOMY (Bilateral)  Patient Location: [facility]  Anesthesia Type:General  Level of Consciousness: awake, alert  and oriented  Airway and Oxygen Therapy: Patient Spontanous Breathing and Patient connected to nasal cannula oxygen  Post-op Pain: none  Post-op Assessment: Post-op Vital signs reviewed, Patient's Cardiovascular Status Stable, Respiratory Function Stable, Patent Airway, No signs of Nausea or vomiting, Adequate PO intake and Pain level controlled              Post-op Vital Signs: Reviewed and stable  Last Vitals:  Filed Vitals:   03/27/15 1345  BP: 119/57  Pulse: 75  Temp: 36.6 C  Resp: 19    Complications: No apparent anesthesia complications

## 2015-03-28 ENCOUNTER — Encounter (HOSPITAL_COMMUNITY): Payer: Self-pay | Admitting: Obstetrics and Gynecology

## 2015-03-28 LAB — CBC
HCT: 31.4 % — ABNORMAL LOW (ref 36.0–46.0)
Hemoglobin: 9.9 g/dL — ABNORMAL LOW (ref 12.0–15.0)
MCH: 25.9 pg — AB (ref 26.0–34.0)
MCHC: 31.5 g/dL (ref 30.0–36.0)
MCV: 82.2 fL (ref 78.0–100.0)
PLATELETS: 256 10*3/uL (ref 150–400)
RBC: 3.82 MIL/uL — AB (ref 3.87–5.11)
RDW: 15.2 % (ref 11.5–15.5)
WBC: 5.9 10*3/uL (ref 4.0–10.5)

## 2015-03-28 NOTE — Progress Notes (Signed)
1 Day Post-Op Procedure(s) (LRB): ATTEMPTED LAPAROSCOPIC ASSISTED VAGINAL HYSTERECTOMY WITH BILATERAL SALPINGECTOMY (Bilateral) HYSTERECTOMY ABDOMINAL WITH BILATERAL SALPINGECTOMY (Bilateral)  Subjective: Patient reports tolerating PO.    Objective: I have reviewed patient's vital signs and labs.  General: alert GI: soft, non-tender; bowel sounds normal; no masses,  no organomegaly and incision: clean Vaginal Bleeding: minimal  Assessment: s/p Procedure(s): ATTEMPTED LAPAROSCOPIC ASSISTED VAGINAL HYSTERECTOMY WITH BILATERAL SALPINGECTOMY (Bilateral) HYSTERECTOMY ABDOMINAL WITH BILATERAL SALPINGECTOMY (Bilateral): stable  Plan: Advance diet  LOS: 1 day    Jeriah Skufca S 03/28/2015, 7:44 AM

## 2015-03-28 NOTE — Op Note (Signed)
Amy Amy [name], Amy [name]                 ACCOUNT NO.:  [id number]  MEDICAL RECORD NO.:  [id number]  LOCATION:  9318                          FACILITY:  [facility]  PHYSICIAN:  Juluis Mire, M.D.   DATE OF BIRTH:  [birth year]  DATE OF PROCEDURE:  03/27/2015 DATE OF DISCHARGE:                              OPERATIVE REPORT   PREOPERATIVE DIAGNOSES:  Menorrhagia and pelvic pain secondary to uterine adenomyosis.  POSTOPERATIVE DIAGNOSES:  Menorrhagia and pelvic pain secondary to uterine adenomyosis.  OPERATIVE PROCEDURE:  Attempted open laparoscopy with subsequent exploratory laparotomy with removal of both fallopian tubes and a total abdominal hysterectomy.  SURGEON:  Juluis Mire, M.D.  ASSISTANTLindie Spruce Amy [name].  ANESTHESIA:  General endotracheal.  ESTIMATED BLOOD LOSS:  200 mL.  PACKS:  None.  DRAINS:  Included urethral Foley.  INTRAOPERATIVE BLOOD PLACED:  None.  COMPLICATIONS:  None.  INDICATIONS:  Dictated in history and physical.  PROCEDURE IN DETAIL:  The patient was taken to the OR and placed in a supine position.  After a satisfactory level of general endotracheal anesthesia was obtained, the patient was placed in dorsal lithotomy position using the Allen stirrups.  Perineum and vagina were prepped out with Betadine.  Hulka tenaculum was put in place.  Bladder was emptied by in and out catheterization.  The abdomen was prepped with DuraPrep.  Subsequent draped in sterile field.  Subumbilical incision was made with knife, extended through the subcutaneous tissue.  Fascia was entered sharply. Incision was fashioned laterally.  Her abdomen was so deep we were unable to safely identify the peritoneum and enter it, therefore we decided to proceed with exploratory laparotomy.  The patient was placed in the supine position.  The abdomen was re-prepped with DuraPrep, draped as a sterile field.  A low-transverse skin incision was made with a knife, carried through  subcutaneous tissue.  Fascia was entered sharply.  Incision was fashioned laterally.  Fascia was taken off the muscle superiorly and inferiorly.  Rectus muscles were separated in the midline.  Peritoneum was entered sharply.  Incision of peritoneum extended both superiorly and inferiorly.  Uterus was elevated through the incision.  It was definitely enlarged with adenomyosis.  Tubes and ovaries were unremarkable.  Each fallopian tube was identified, the mesosalpinx was then clamped, cut, and the pedicles were secured with free ties of 0-Vicryl.  Next, we went to the right side.  The right round ligament was clamped, cut, and suture ligated with 0-Vicryl. Bladder flap was then developed.  The utero-ovarian pedicle was isolated, clamped, cut, and doubly ligated first with a free tie of 0- Vicryl, then a suture ligature of 0-Vicryl.  The uterine vessels on the right side were skeletonized, clamped, cut.  Suture ligated with 0- Vicryl.  Then, we went to the left side.  The left round ligament was clamped, cut, and suture ligated with 0-Vicryl.  The bladder flap was further developed.  The left utero-ovarian pedicle was isolated, clamped, cut, doubly ligated first with a free tie of 0-Vicryl, then a suture ligature of 0-Vicryl.  Next, the left uterine vessels were skeletonized, clamped, cut, and suture ligated with 0-Vicryl.  Bladder flap was  further developed using the clamp, cut and tie technique with suture ligature of 0-Vicryl.  Parametrium was serially separated from the sides of uterus.  Vaginal angles were clamped and cut.  Intervening vaginal mucosa was sized and the uterus was passed off the operative field.  The held angles were secured with suture ligature of 0-Vicryl. The intervening vaginal mucosa was closed with interrupted sutures of 0- Vicryl.  Areas of bleeding were brought under control with figure-of- eights of 0-Vicryl.  At this point in time, we had clear urine output. We  irrigated the pelvis.  We had good hemostasis at the vaginal cuff. Left ovary did have some oozing, brought under control with a suture ligature of 0-Vicryl and Bovie.  It looked like we did not compromise the vasculature.  Right ovary was hemostatically intact.  Appendix was visualized and noted to be normal.  Next, the fascia defect of the subumbilical incision was identified and closed with figure-of-eight 0-PDS.  Fascia was closed with running suture of 0-Vicryl.  Fascia was closed in a running suture of 0-PDS. Deep tissue was closed with a running suture of 0-plain catgut.  Skin was closed in running subcuticular 4-0 Vicryl.  Dermabond was also applied.  Subumbilical incision was closed with interrupted subcuticulars of 4-0 Vicryl.  Exparel had been given of note.  At this point in time, we had good clear urine output.  Sponge, instrument, and needle count was reported correct by circulating nurse x2.  The patient did tolerate the procedure well.  Once extubated, transferred to the recovery room in good condition.     Juluis MireJohn S. Amy Amy [name], M.D.     JSM/MEDQ  D:  03/27/2015  T:  03/28/2015  Job:  [id number]

## 2015-03-29 MED ORDER — OXYCODONE-ACETAMINOPHEN 7.5-325 MG PO TABS: 1.0000 | ORAL_TABLET | ORAL | Status: DC | PRN

## 2015-03-29 NOTE — Progress Notes (Signed)
2 Days Post-Op Procedure(s) (LRB): ATTEMPTED LAPAROSCOPIC ASSISTED VAGINAL HYSTERECTOMY WITH BILATERAL SALPINGECTOMY (Bilateral) HYSTERECTOMY ABDOMINAL WITH BILATERAL SALPINGECTOMY (Bilateral)  Subjective: Patient reports tolerating PO and no problems voiding.    Objective: I have reviewed patient's vital signs.  General: alert GI: soft, non-tender; bowel sounds normal; no masses,  no organomegaly and incision: clean Vaginal Bleeding: none  Assessment: s/p Procedure(s): ATTEMPTED LAPAROSCOPIC ASSISTED VAGINAL HYSTERECTOMY WITH BILATERAL SALPINGECTOMY (Bilateral) HYSTERECTOMY ABDOMINAL WITH BILATERAL SALPINGECTOMY (Bilateral): stable  Plan: Discharge home  LOS: 2 days    Laiklyn Pilkenton S 03/29/2015, 8:03 AM

## 2015-03-29 NOTE — Discharge Summary (Signed)
  Patient name  Amy [name], Amy [name] DICTATION# [id number] CSN# [id number]  [facility]Zarielle Cea S, MD 03/29/2015 8:07 AM

## 2015-03-29 NOTE — Progress Notes (Signed)
Discharge instructions provided to patient and significant other at bedside.  Activity, medications, follow up appointments, when to call the doctor and community resources discussed.  No questions at this time.  Patient left unit in stable condition with all personal belongings and prescriptions accompanied by staff.  K. Keavon Sensing, RN------------------------   

## 2015-03-29 NOTE — Discharge Summary (Signed)
[name]:  [name], [name]                 ACCOUNT NO.:  [id number]  MEDICAL RECORD NO.:  [id number]  LOCATION:  9318                          FACILITY:  [facility]  PHYSICIAN:  Juluis MireJohn S. Shelbey Spindler, M.D.   DATE OF BIRTH:  28-Sep-1970  DATE OF ADMISSION:  03/27/2015 DATE OF DISCHARGE:  03/29/2015                              DISCHARGE SUMMARY   ADMITTING DIAGNOSES:  Menorrhagia and dysmenorrhea secondary to adenomyosis.  DISCHARGE DIAGNOSES:  Menorrhagia and dysmenorrhea secondary to adenomyosis.  OPERATIVE PROCEDURE:  Attempted laparoscopic vaginal hysterectomy with subsequent exploratory surgery with total abdominal hysterectomy, removal of both fallopian tubes.  For complete history and physical, please see dictated note.  COURSE IN THE HOSPITAL:  The patient underwent above-noted surgery.  Did well postop.  Postop hemoglobin was 9.9.  At the time of discharge, she was afebrile with stable vital signs.  Abdomen was soft.  Bowel sounds were active.  All incisions were clear.  She was having minimal spotting.  In terms of complications, none were encountered during her stay in the hospital.  The patient discharged home in stable condition.  DISPOSITION:  The patient is to avoid heavy lifting or vaginal entrance. Car driving should be limited.  She was instructed to call if there are signs of infection in terms of fever.  Should report any nausea or vomiting.  Excessive pain should be reported.  Heavy vaginal bleeding should be reported.  Instructed on signs and symptoms of deep venous thrombosis and pulmonary embolus.  Discharged home with Percocet as needed for pain.  PLAN:  Follow up in the office in 1 week.     Juluis MireJohn S. Azriel Dancy, M.D.     JSM/MEDQ  D:  03/29/2015  T:  03/29/2015  Job:  [id number]

## 2015-09-04 ENCOUNTER — Telehealth: Payer: Self-pay | Admitting: Cardiovascular Disease

## 2015-09-04 NOTE — Telephone Encounter (Signed)
[facility] 09/29/15 with Dr Duke Salviaandolph.  [facility] for Dr Leonides Sakeandolph's schedule on 09/29/15. lp

## 2015-09-26 ENCOUNTER — Telehealth: Payer: Self-pay | Admitting: Cardiovascular Disease

## 2015-09-28 ENCOUNTER — Ambulatory Visit (INDEPENDENT_AMBULATORY_CARE_PROVIDER_SITE_OTHER): Payer: Managed Care, Other (non HMO) | Admitting: Cardiovascular Disease

## 2015-09-28 ENCOUNTER — Encounter: Payer: Self-pay | Admitting: Cardiovascular Disease

## 2015-09-28 VITALS — BP 134/97 | HR 84 | Ht 67.5 in | Wt 325.0 lb

## 2015-09-28 DIAGNOSIS — R55 Syncope and collapse: Secondary | ICD-10-CM | POA: Diagnosis not present

## 2015-09-28 DIAGNOSIS — I10 Essential (primary) hypertension: Secondary | ICD-10-CM | POA: Diagnosis not present

## 2015-09-28 DIAGNOSIS — R0602 Shortness of breath: Secondary | ICD-10-CM

## 2015-09-28 DIAGNOSIS — R079 Chest pain, unspecified: Secondary | ICD-10-CM

## 2015-09-28 DIAGNOSIS — I493 Ventricular premature depolarization: Secondary | ICD-10-CM | POA: Insufficient documentation

## 2015-09-28 HISTORY — DX: Essential (primary) hypertension: I10

## 2015-09-28 HISTORY — DX: Ventricular premature depolarization: I49.3

## 2015-09-28 HISTORY — DX: Syncope and collapse: R55

## 2015-09-28 NOTE — Patient Instructions (Addendum)
Medication Instructions:  STOP THE HYDROCHLOROTHIAZIDE   Labwork: NONE  Testing/Procedures: Your physician has requested that you have an exercise tolerance test. For further information please visit [email or web address]. Please also follow instruction sheet, as given.  Follow-Up: Your physician recommends that you schedule a follow-up appointment in: 1 MONTH OV   If you need a refill on your cardiac medications before your next appointment, please call your pharmacy.

## 2015-09-28 NOTE — Progress Notes (Signed)
Cardiology Office Note   Date:  09/28/2015   ID:  Amy [name], DOB 1971/04/16, MRN [id number]  PCP:  Farris Has, MD  Cardiologist:   Chilton Si, MD   Chief Complaint  Patient presents with  . New Evaluation    Referred by PCP Farris Has, MD) for Palpitations and Pre-Syncope  pt c/o feeling her heart racing and feeling like she is going to pass out--becomes very dizzy, happens randomly; occasionally just feels her heart flutter and will sometimes feel chest discomfort on left side--occasionally feels like she is going to pass out when walking on treadmill, has been random; SOB during episodes; swelling in legs/feet/ankles--has not noticed a change  . Stress    pt seems worried/stressed, she is not sure if she is having panic attacks  PVCs showed up on EKG 1 year ago--had a cold and was taking Dayquil/Nyquil     History of Present Illness: Amy [name] is a 45 y.o. female who presents for an evaluation of palpitations and pre-syncope.  Over the last two months she reports episodes of her heart racing and feeling like she is going to pass out.  It is associated with shortness of breath.  The episodes typically occur after she has been standing for a while in a hot situation.  The first occurred while cheering at her nephew's basketball game.  She notes that it was very hot in the gym.  She felt very lightheaded and nauseous.  She then noted palpitations and shortness of breath, but thinks this may have been because she started feeling anxious.  She walked down the steps trying to get outside and thought she may pass out.  She had another similar episode at another game.  This has also occurred after standing and singing at church.  There is no associated chest pain.  One time she was walking on the treadmill and go very lightheaded after walking for approximately 10 minutes.    Amy [name] also reports episodes of palpitations.  Years ago she was seen in the ED and noted to have  palpitations. At the time it was attributed to frequent use of Nyquil.  After stopping this medicine they were well-controlled.  Over the last 1.5 months the palpitations have recurred.  She denies using caffeine or other OTC medications.  The fluttering lasts for less than one minute and improves with deep breathing.  The last episode was yesterday.    She has been on HCTZ for the last couple of years.  Lately she takes it as needed for lower extremity edema.  She uses it a couple times per week.      Past Medical History  Diagnosis Date  . Hypertension   . Anemia   . Vitamin D deficiency   . Leg swelling   . Diabetes mellitus without complication (HCC)     PRE-DIABETIC, NO LONGER NEED MEDICATION  . PONV (postoperative nausea and vomiting)   . Pre-syncope 09/28/2015  . Essential hypertension 09/28/2015  . PVC (premature ventricular contraction) 09/28/2015    Past Surgical History  Procedure Laterality Date  . Cesarean section    . Tubal ligation    . Wisdom tooth extraction    . Laparoscopic vaginal hysterectomy with salpingectomy Bilateral 03/27/2015    Procedure: ATTEMPTED LAPAROSCOPIC ASSISTED VAGINAL HYSTERECTOMY WITH BILATERAL SALPINGECTOMY;  Surgeon: Richardean Chimera, MD;  Location: [facility];  Service: Gynecology;  Laterality: Bilateral;  . Abdominal hysterectomy Bilateral 03/27/2015    Procedure: HYSTERECTOMY ABDOMINAL  WITH BILATERAL SALPINGECTOMY;  Surgeon: Richardean ChimeraJohn McComb, MD;  Location: [facility];  Service: Gynecology;  Laterality: Bilateral;     Current Outpatient Prescriptions  Medication Sig Dispense Refill  . Cholecalciferol (VITAMIN D3) 10000 units capsule Take 10,000 Units by mouth daily.    Marland Kitchen. ibuprofen (ADVIL,MOTRIN) 200 MG tablet Take 600-800 mg by mouth every 6 (six) hours as needed (For menstrual cramping.).    Marland Kitchen. Multiple Vitamin (MULTIVITAMIN WITH MINERALS) TABS tablet Take 1 tablet by mouth daily after breakfast.     No current facility-administered medications for this  visit.    Allergies:   Lisinopril    Social History:  The patient  reports that she has never smoked. She has never used smokeless tobacco. She reports that she drinks alcohol. She reports that she does not use illicit drugs.   Family History:  The patient's family history is not on file.    ROS:  Please see the history of present illness.   Otherwise, review of systems are positive for none.   All other systems are reviewed and negative.    PHYSICAL EXAM: VS:  BP 134/97 mmHg  Pulse 84  Ht 5' 7.5" (1.715 m)  Wt 147.419 kg (325 lb)  BMI 50.12 kg/m2 , BMI Body mass index is 50.12 kg/(m^2).   Laying: 124/84, HR 85 Sitting: 132/88; HR 95 Standing: 142/94; HR 104 Standing 3 min: 148/96, HR 100 GENERAL:  Well appearing HEENT:  Pupils equal round and reactive, fundi not visualized, oral mucosa unremarkable NECK:  No jugular venous distention, waveform within normal limits, carotid upstroke brisk and symmetric, no bruits, no thyromegaly LYMPHATICS:  No cervical adenopathy LUNGS:  Clear to auscultation bilaterally HEART:  RRR.  PMI not displaced or sustained,S1 and S2 within normal limits, no S3, no S4, no clicks, no rubs, no murmurs ABD:  Flat, positive bowel sounds normal in frequency in pitch, no bruits, no rebound, no guarding, no midline pulsatile mass, no hepatomegaly, no splenomegaly EXT:  2 plus pulses throughout, no edema, no cyanosis no clubbing SKIN:  No rashes no nodules NEURO:  Cranial nerves II through XII grossly intact, motor grossly intact throughout PSYCH:  Cognitively intact, oriented to person place and time   EKG:  EKG is ordered today. The ekg ordered today demonstrates sinus rhythm. Rate 84 bpm. Low voltage in the precordial leads.   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  This may have been triggered by being in a hot room.  She reports drinking adequate amounts of fluid but intermittently uses a diuretic. The episodes typically occur after she has been standing for several minutes. It is also possible that this could be due to POTS.  However, this is less likely in a 45 year old woman. She was not orthostatic on exam today.   She will stop hydrochlorothiazide. I recommended that she increase her fluid intake to at least 2 L daily. I also recommended compression stockings. We discussed the importance of not trying to fight the symptoms that she has presyncope. Rather, I recommended that she stop and sit or  lay down immediately. I also recommended that she elevate her legs if possible.  Given that she has hypertension I would like to avoid florinef or midodrine if possible.  Given that she had one episode while working out, we will also get an ETT to rule out ischemia.  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  We discussed trying a beta blocker but she is not interested at this time. She recently had lab work with her PCP.  We'll check a basic metabolic panel, magnesium, CBC, TSH, and free T4 if they have not already been ordered.   # Hypertension: BP iselevated today.   Given her issues with presyncope a diuretic may not be the best choice for her. We discussed the possibility of starting a beta blocker, which may also help with her PVCs. However, she is not interested at this time. I've asked her to keep a log of her blood pressures and we will discuss this at her follow-up appointment.    Current medicines are reviewed at length with the patient today.  The patient does not have concerns regarding medicines.  The following changes have been  made:  Stop HCTZ  Labs/ tests ordered today include:   Orders Placed This Encounter  Procedures  . Exercise Tolerance Test  . EKG 12-Lead     Disposition:   FU with Charnika Herbst C. Duke Salvia, MD, [facility] in 1 month.    This note was written with the assistance of speech recognition software.  Please excuse any transcriptional errors.  Signed, Belva Koziel C. Duke Salvia, MD, [facility]  09/28/2015 5:28 PM    [facility]

## 2015-09-29 ENCOUNTER — Telehealth: Payer: Self-pay | Admitting: Cardiovascular Disease

## 2015-09-29 ENCOUNTER — Ambulatory Visit: Payer: Managed Care, Other (non HMO) | Admitting: Cardiovascular Disease

## 2015-09-29 ENCOUNTER — Encounter: Payer: Self-pay | Admitting: Cardiovascular Disease

## 2015-09-29 NOTE — Telephone Encounter (Signed)
Closed encounter °

## 2015-09-29 NOTE — Telephone Encounter (Signed)
Left message for patient letting her know gxt is scheduled for 10-11-15 and asked that she call back to make follow up appt with dr Duke Salviarandolph in 1 month.

## 2015-10-11 ENCOUNTER — Encounter (HOSPITAL_COMMUNITY): Payer: Managed Care, Other (non HMO)

## 2015-10-13 ENCOUNTER — Telehealth (HOSPITAL_COMMUNITY): Payer: Self-pay

## 2015-10-13 NOTE — Telephone Encounter (Signed)
Encounter complete. 

## 2015-10-17 ENCOUNTER — Ambulatory Visit: Payer: Managed Care, Other (non HMO) | Admitting: Cardiovascular Disease

## 2015-10-17 ENCOUNTER — Ambulatory Visit (HOSPITAL_COMMUNITY)
Admission: RE | Admit: 2015-10-17 | Discharge: 2015-10-17 | Disposition: A | Discharge status: Home / Self Care | Payer: Managed Care, Other (non HMO) | Source: Ambulatory Visit | Attending: Cardiology | Admitting: Cardiology

## 2015-10-17 DIAGNOSIS — R079 Chest pain, unspecified: Secondary | ICD-10-CM | POA: Insufficient documentation

## 2015-10-17 DIAGNOSIS — R0602 Shortness of breath: Secondary | ICD-10-CM | POA: Diagnosis not present

## 2015-10-17 LAB — EXERCISE TOLERANCE TEST
CHL CUP MPHR: 174 {beats}/min
CHL CUP RESTING HR STRESS: 87 {beats}/min
CSEPEDS: 32 s
CSEPEW: 6.4 METS
Exercise duration (min): 4 min
Peak HR: 166 {beats}/min
Percent HR: 95 %
RPE: 17

## 2015-10-18 ENCOUNTER — Telehealth: Payer: Self-pay | Admitting: Cardiovascular Disease

## 2015-10-18 DIAGNOSIS — R002 Palpitations: Secondary | ICD-10-CM

## 2015-10-18 NOTE — Telephone Encounter (Signed)
LEFT MESSAGE TO CALL BACK - ASK TO SPEAK TO MELINDA OR SHARON

## 2015-10-18 NOTE — Progress Notes (Signed)
Cardiology Office Note   Date:  10/19/2015   ID:  Amy [name], DOB Apr 11, 1971, MRN [id number]  PCP:  Farris Has, MD  Cardiologist:   Chilton Si, MD   Chief Complaint  Patient presents with  . Follow-up    stress test  pt states no new Sx.     History of Present Illness: Amy [name] is a 45 y.o. female with PVCs and hypertension who presents for follow up on palpitations and pre-syncope.  The episodes of presyncope were felt be neurocardiogenic. HCTZ was discontinued and she was encouraged to increase her fluid intake and wear compression stockings.  She was offered a beta blocker but declined.   She underwent an ETT on 10/17/15 that was negative for ischemia.  She exercised for 4 minutes on a Bruce protocol, which is 6.4 METS.  Since her last appointment Amy [name]  has been doing well.  She has noted a couple quick episodes of feeling lightheaded but not nearly as severe as before her last appointment.  The episodes last for a few seconds.  She denies presyncope or palpitations. she has increased her fluid intake is drinking 48-64 oz of water in addition to other liquids throughout the day. She has checked her blood pressure at home. Typically it has been well-controlled.  The highest was 142/88.  She took one  hydrochlorothiazide tablet since her last appointment because her ankles were swelling.   Amy [name] has not been exercising regularly. She plans to start back going to the gym 3-4 times weekly seen.     Past Medical History  Diagnosis Date  . Hypertension   . Anemia   . Vitamin D deficiency   . Leg swelling   . Diabetes mellitus without complication (HCC)     PRE-DIABETIC, NO LONGER NEED MEDICATION  . PONV (postoperative nausea and vomiting)   . Pre-syncope 09/28/2015  . Essential hypertension 09/28/2015  . PVC (premature ventricular contraction) 09/28/2015    Past Surgical History  Procedure Laterality Date  . Cesarean section    . Tubal ligation    . Wisdom  tooth extraction    . Laparoscopic vaginal hysterectomy with salpingectomy Bilateral 03/27/2015    Procedure: ATTEMPTED LAPAROSCOPIC ASSISTED VAGINAL HYSTERECTOMY WITH BILATERAL SALPINGECTOMY;  Surgeon: Richardean Chimera, MD;  Location: [facility];  Service: Gynecology;  Laterality: Bilateral;  . Abdominal hysterectomy Bilateral 03/27/2015    Procedure: HYSTERECTOMY ABDOMINAL WITH BILATERAL SALPINGECTOMY;  Surgeon: Richardean Chimera, MD;  Location: [facility];  Service: Gynecology;  Laterality: Bilateral;     Current Outpatient Prescriptions  Medication Sig Dispense Refill  . Cholecalciferol (VITAMIN D3) 10000 units capsule Take 10,000 Units by mouth daily.    . hydrochlorothiazide (MICROZIDE) 12.5 MG capsule Take 1 capsule (12.5 mg total) by mouth daily as needed (FOR SWELLING OR ELEVATED BLOOD PRESSURE). 90 capsule 1  . ibuprofen (ADVIL,MOTRIN) 200 MG tablet Take 200 mg by mouth as needed.    . Multiple Vitamin (MULTIVITAMIN WITH MINERALS) TABS tablet Take 1 tablet by mouth daily after breakfast.     No current facility-administered medications for this visit.    Allergies:   Lisinopril    Social History:  The patient  reports that she has never smoked. She has never used smokeless tobacco. She reports that she drinks alcohol. She reports that she does not use illicit drugs.   Family History:  The patient's family history includes Arthritis in her maternal grandmother; Dementia in her father; Diabetes in her mother, paternal  grandmother, and sister; Hypertension in her maternal grandmother, mother, sister, and sister; Stroke in her paternal grandmother.    ROS:  Please see the history of present illness.   Otherwise, review of systems are positive for none.   All other systems are reviewed and negative.    PHYSICAL EXAM: VS:  BP 128/76 mmHg  Pulse 92  Ht 5\' 7"  (1.702 m)  Wt 326 lb 9.6 oz (148.145 kg)  BMI 51.14 kg/m2 , BMI Body mass index is 51.14 kg/(m^2).   GENERAL:  Well appearing HEENT:  Pupils  equal round and reactive, fundi not visualized, oral mucosa unremarkable NECK:  No jugular venous distention, waveform within normal limits, carotid upstroke brisk and symmetric, no bruits LYMPHATICS:  No cervical adenopathy LUNGS:  Clear to auscultation bilaterally HEART:  RRR.  PMI not displaced or sustained,S1 and S2 within normal limits, no S3, no S4, no clicks, no rubs, no murmurs ABD:  Flat, positive bowel sounds normal in frequency in pitch, no bruits, no rebound, no guarding, no midline pulsatile mass, no hepatomegaly, no splenomegaly EXT:  2 plus pulses throughout, no edema, no cyanosis no clubbing SKIN:  No rashes no nodules NEURO:  Cranial nerves II through XII grossly intact, motor grossly intact throughout PSYCH:  Cognitively intact, oriented to person place and time   EKG:  EKG is not ordered today. The ekg ordered 09/28/15 demonstrates sinus rhythm. Rate 84 bpm. Low voltage in the precordial leads.   Recent Labs: 03/21/2015: BUN 8; Creatinine, Ser 0.67; Potassium 3.7; Sodium 137 03/28/2015: Hemoglobin 9.9*; Platelets 256    Lipid Panel No results found for: CHOL, TRIG, HDL, CHOLHDL, VLDL, LDLCALC, LDLDIRECT    Wt Readings from Last 3 Encounters:  10/19/15 326 lb 9.6 oz (148.145 kg)  09/28/15 325 lb (147.419 kg)  03/27/15 320 lb (145.151 kg)      ASSESSMENT AND PLAN:  # Neurocardiogenic syncope: Symptoms have resolved with increased fluid intake. She had a stress test that was negative for ischemia.  # PVCs: Symptoms have been much better lately. It is possible that the diuretic was triggering this, though her potassium levels have never been very low. She did not have her thyroid function testing at the last appointment. We will check a TSH and free T4 today.   # Hypertension: Blood pressure has been well-controlled. She will take HCTZ 12.5mg  daily as needed . She does have elevated blood pressures or edema.  Current medicines are reviewed at length with the  patient today.  The patient does not have concerns regarding medicines.  The following changes have been made:    Labs/ tests ordered today include:   No orders of the defined types were placed in this encounter.    Time spent: 15 minutes-Greater than 50% of this time was spent in counseling, explanation of diagnosis, planning of further management, and coordination of care.   Disposition:   FU with Elicia Lui C. Duke Salviaandolph, MD, [facility] as needed.    This note was written with the assistance of speech recognition software.  Please excuse any transcriptional errors.  Signed, Bracen Schum C. Duke Salviaandolph, MD, [facility]  10/19/2015 2:05 PM     [facility]

## 2015-10-18 NOTE — Telephone Encounter (Signed)
New message   Pt is calling to speak to rn and she want sot know if another appt needs to be scheduled

## 2015-10-18 NOTE — Telephone Encounter (Signed)
New message ° ° ° ° °Returning a call to the nurse °

## 2015-10-18 NOTE — Telephone Encounter (Signed)
Spoke with patient and she will keep ov tomorrow and get labs after visit     Chilton Siiffany Numa, MD  Burnell BlanksMelinda B Pratt           Labs from PCP were reviewed and were normal. However, they did not check her thyroid function. If abnormal, this can cause palpitation. Please chest TSH and fT4. Thanks!

## 2015-10-18 NOTE — Telephone Encounter (Signed)
Follow-up ° ° ° ° ° °The pt is returning the nurses call, no other information provided °

## 2015-10-18 NOTE — Telephone Encounter (Signed)
Dr.Crane advised exercise capacity poor.She recommends work on exercising 30 min daily most days of week.Stated she has appointment with Dr.Lyerly 10/19/15 and wants to know if she needs to keep appointment.Message sent to her nurse [place].

## 2015-10-18 NOTE — Telephone Encounter (Signed)
Returned call to patient.Advised GXT done 10/17/15 was normal.Dr.Roy Lake advised

## 2015-10-18 NOTE — Telephone Encounter (Signed)
F/u  Pt calling RN back

## 2015-10-19 ENCOUNTER — Encounter: Payer: Self-pay | Admitting: Cardiovascular Disease

## 2015-10-19 ENCOUNTER — Ambulatory Visit: Payer: Managed Care, Other (non HMO) | Admitting: Cardiovascular Disease

## 2015-10-19 ENCOUNTER — Ambulatory Visit (INDEPENDENT_AMBULATORY_CARE_PROVIDER_SITE_OTHER): Payer: Managed Care, Other (non HMO) | Admitting: Cardiovascular Disease

## 2015-10-19 VITALS — BP 128/76 | HR 92 | Ht 67.0 in | Wt 326.6 lb

## 2015-10-19 DIAGNOSIS — R55 Syncope and collapse: Secondary | ICD-10-CM | POA: Diagnosis not present

## 2015-10-19 DIAGNOSIS — I10 Essential (primary) hypertension: Secondary | ICD-10-CM

## 2015-10-19 DIAGNOSIS — I493 Ventricular premature depolarization: Secondary | ICD-10-CM

## 2015-10-19 LAB — T4, FREE: FREE T4: 1.1 ng/dL (ref 0.8–1.8)

## 2015-10-19 LAB — TSH: TSH: 1.16 m[IU]/L

## 2015-10-19 MED ORDER — HYDROCHLOROTHIAZIDE 12.5 MG PO CAPS: 12.5000 mg | ORAL_CAPSULE | Freq: Every day | ORAL | Status: AC | PRN

## 2015-10-19 NOTE — Patient Instructions (Signed)
Medication Instructions:  TAKE THE HYDROCHLOROTHIAZIDE 12.5 MG DAILY AS NEEDED FOR SWELLING OR ELEVATED BLOOD PRESSURE  Labwork: TSH/FT4 AT [facility] ON THE FIRST FLOOR  Testing/Procedures: NONE  Follow-Up: AS NEEDED   If you need a refill on your cardiac medications before your next appointment, please call your pharmacy.

## 2015-10-23 ENCOUNTER — Telehealth: Payer: Self-pay | Admitting: *Deleted

## 2015-10-23 NOTE — Telephone Encounter (Signed)
Patient was seen last week for follow up ov

## 2015-10-23 NOTE — Telephone Encounter (Signed)
-----   Message from Chilton Siiffany [place], MD sent at 10/22/2015 10:30 PM EDT ----- Normal thyroid function

## 2015-10-23 NOTE — Telephone Encounter (Signed)
Left message to call back  

## 2015-10-23 NOTE — Telephone Encounter (Deleted)
-----   Message from Tiffany Sangaree, MD sent at 10/22/2015 10:30 PM EDT ----- Normal thyroid function 

## 2015-10-30 ENCOUNTER — Other Ambulatory Visit: Payer: Self-pay | Admitting: Family Medicine

## 2015-10-30 DIAGNOSIS — R519 Headache, unspecified: Secondary | ICD-10-CM

## 2015-10-30 DIAGNOSIS — R51 Headache: Principal | ICD-10-CM

## 2015-11-07 NOTE — Telephone Encounter (Signed)
Notes Recorded by Burnell BlanksMelinda B Miliana Gangwer on 10/23/2015 at 10:27 AM Advised patient of lab results

## 2015-11-09 ENCOUNTER — Ambulatory Visit
Admission: RE | Admit: 2015-11-09 | Discharge: 2015-11-09 | Disposition: A | Discharge status: Home / Self Care | Payer: Managed Care, Other (non HMO) | Source: Ambulatory Visit | Attending: Family Medicine | Admitting: Family Medicine

## 2015-11-09 DIAGNOSIS — R51 Headache: Principal | ICD-10-CM

## 2015-11-09 DIAGNOSIS — R519 Headache, unspecified: Secondary | ICD-10-CM

## 2015-11-09 MED ORDER — IOPAMIDOL (ISOVUE-300) INJECTION 61%
80.0000 mL | Freq: Once | INTRAVENOUS | Status: AC | PRN
  Administered 2015-11-09: 80 mL via INTRAVENOUS

## 2015-11-20 ENCOUNTER — Ambulatory Visit: Payer: Managed Care, Other (non HMO) | Admitting: Neurology

## 2016-06-13 IMAGING — US US SOFT TISSUE HEAD/NECK
1 series · 13 of 25 positions shown · non-contrast
Comparison: None.

CLINICAL DATA: Thyromegaly by physical examination.

EXAM:
THYROID ULTRASOUND
TECHNIQUE: Ultrasound examination of the thyroid gland and adjacent soft
tissues was performed.

[Series 1: us soft tissue head/neck · 0.08mm/px · 13 of 41 slices shown]
[im 1/41]
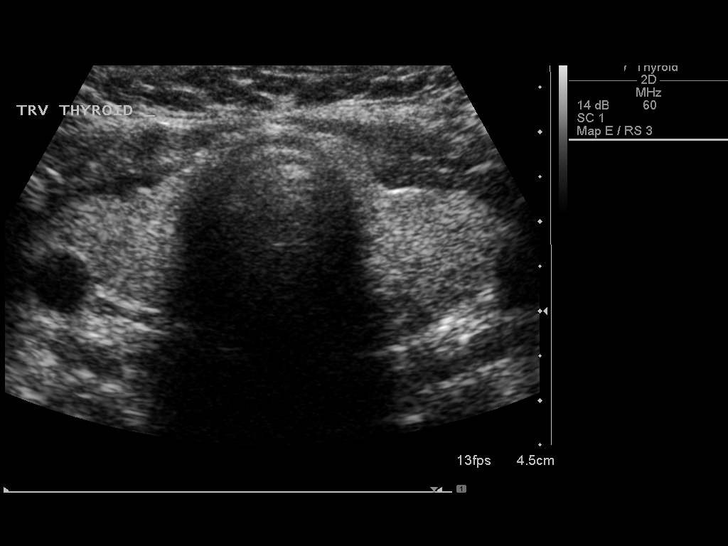
[im 4/41]
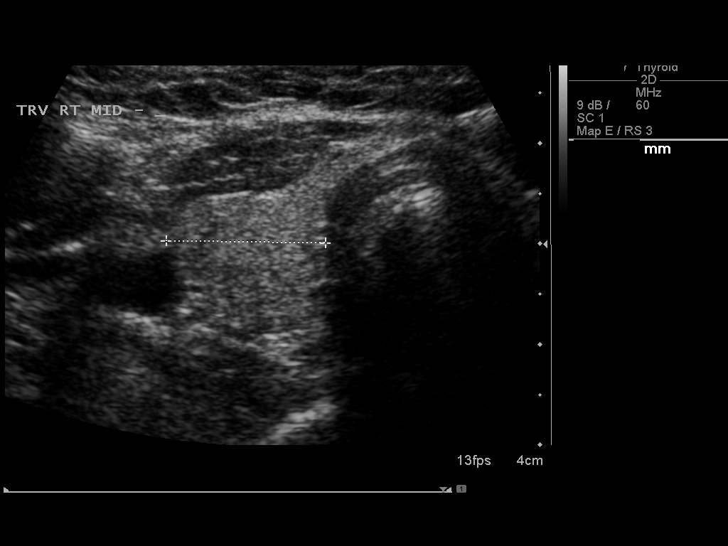
[im 7/41]
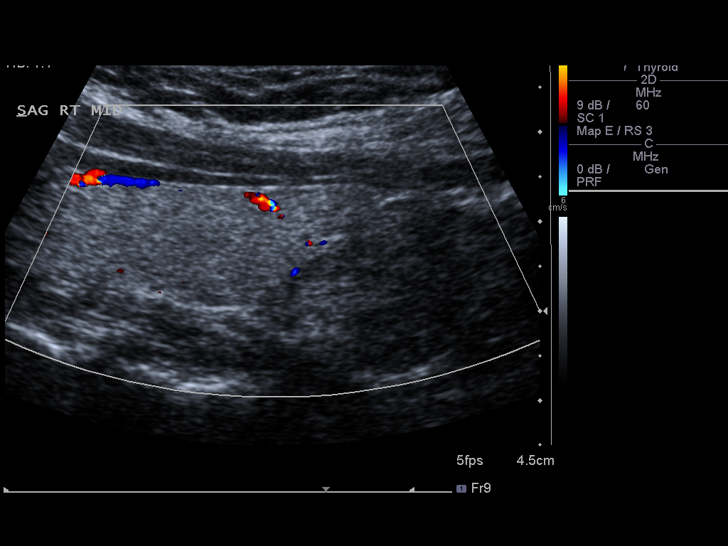
[im 11/41]
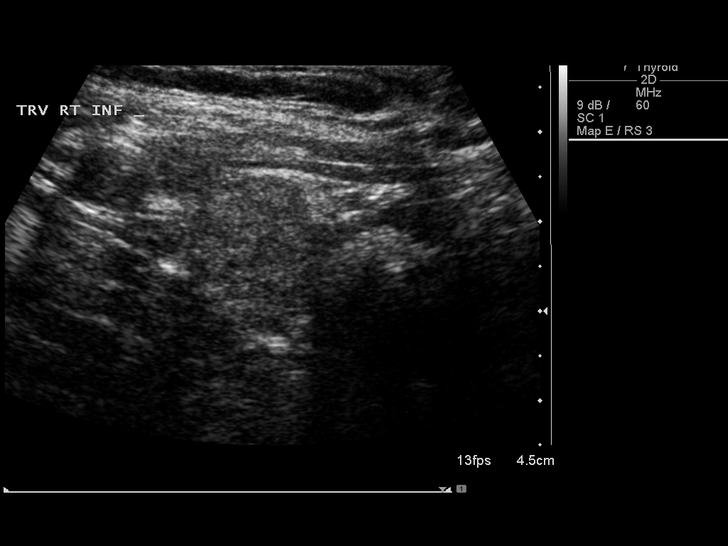
[im 14/41]
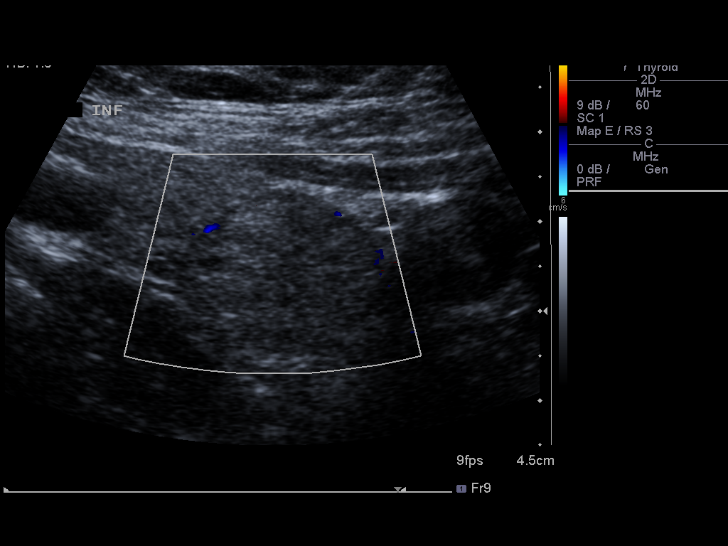
[im 17/41]
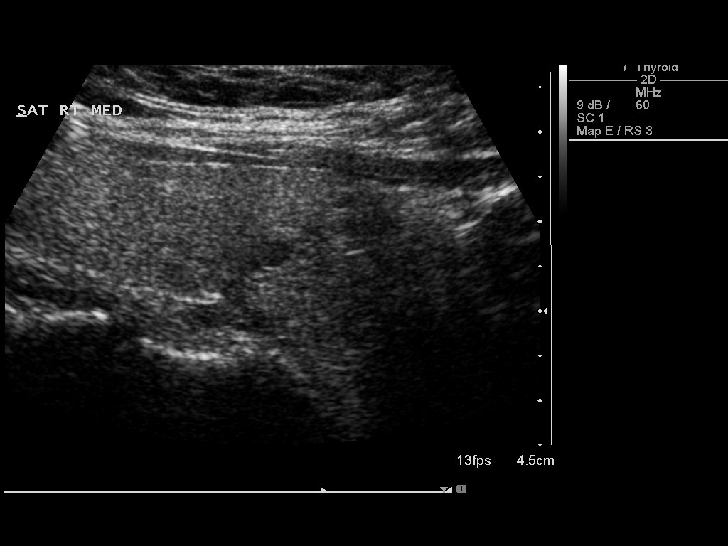
[im 21/41]
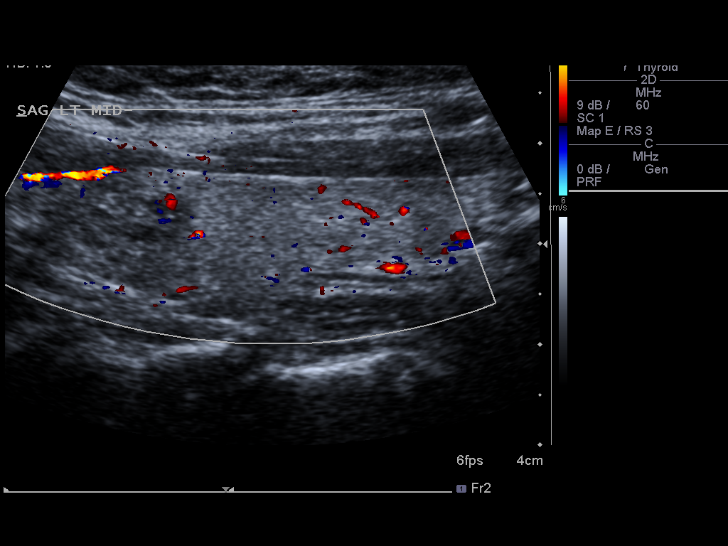
[im 24/41]
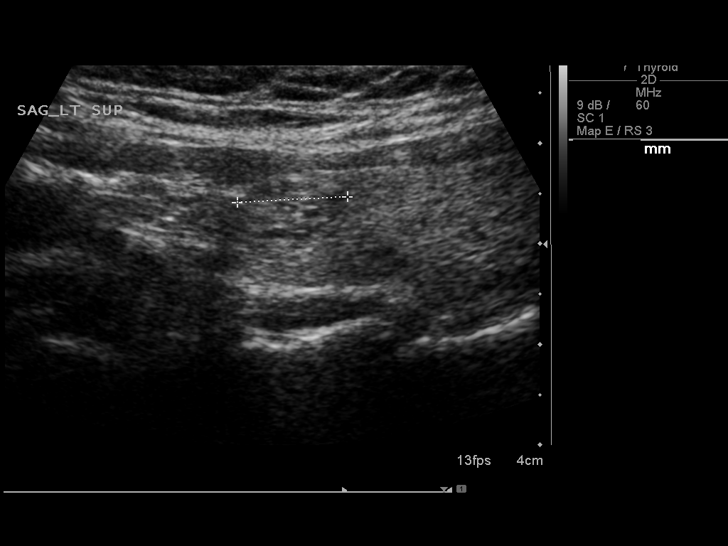
[im 27/41]
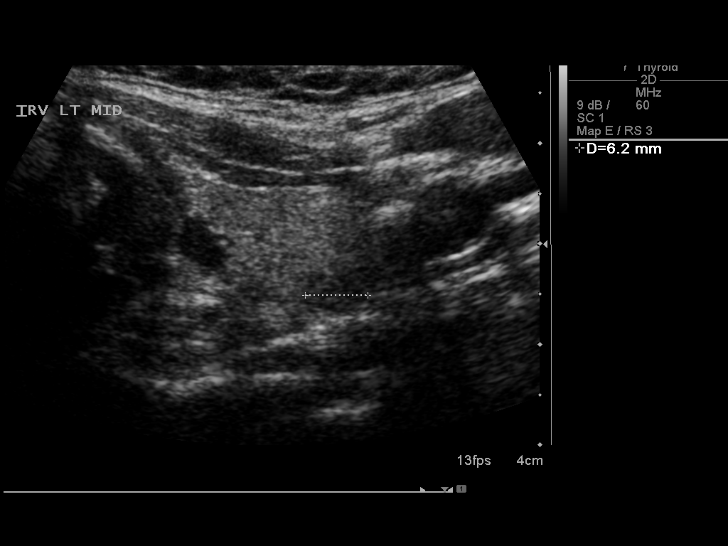
[im 31/41]
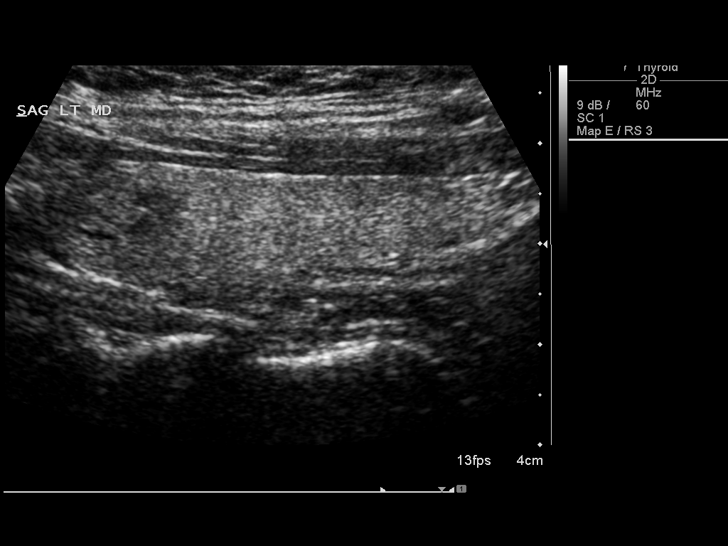
[im 34/41]
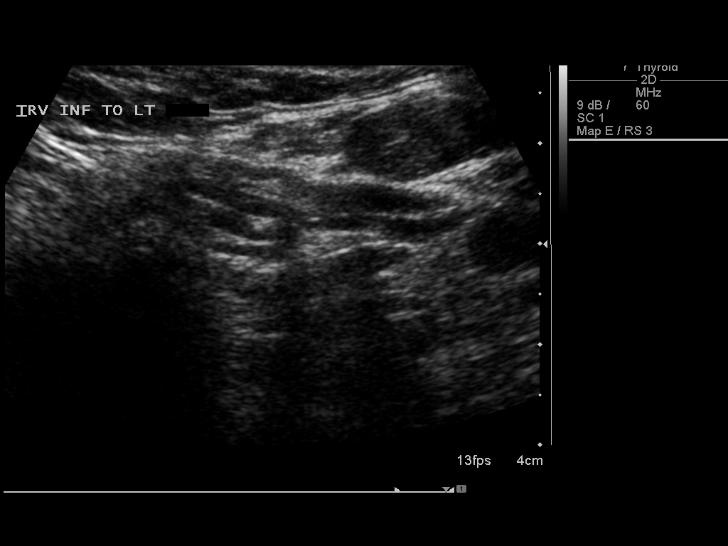
[im 37/41]
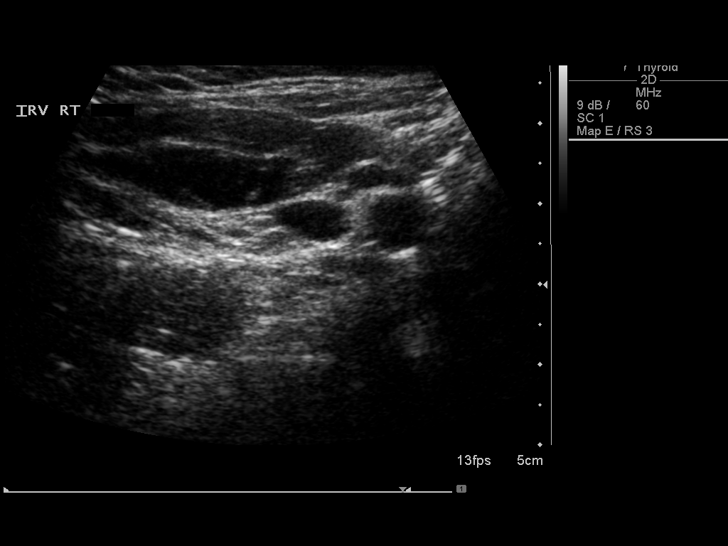
[im 41/41]
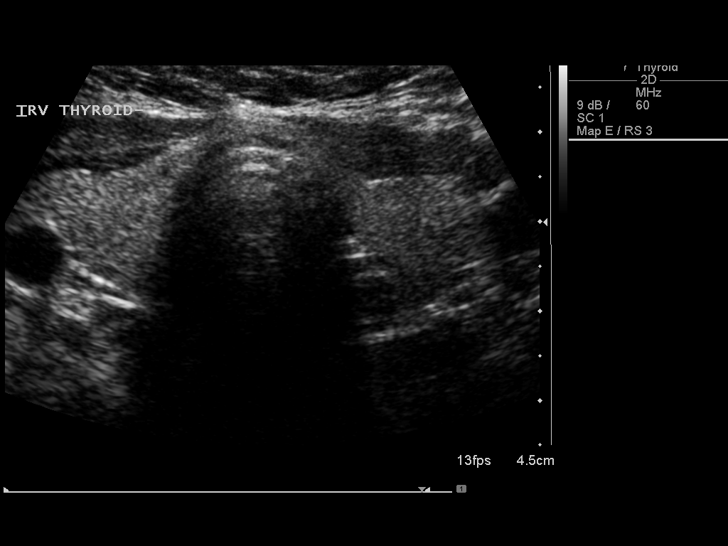

[13 of 25 positions shown; findings below may reference images not displayed]

FINDINGS: There is nonspecific mild diffuse heterogeneity of thyroid
parenchymal echotexture.

Right thyroid lobe

Measurements: Borderline enlarged measuring 5.0 x 2.1 x 1.6 cm.

Right, inferior - 1.5 x 1.8 x 2.4 cm - mixed echogenic, solid

Left thyroid lobe

Measurements: Borderline enlarged measuring 4.8 x 1.3 x 1.8 cm.

Left, superior - 1.2 x 0.8 x 1.1 cm-mixed echogenic, solid,
ill-defined, potentially a pseudo nodule.

Left, mid, posterior - 0.6 cm-hypoechoic, solid

Isthmus

Thickness: Borderline enlarged measuring 0.4 cm in diameter.

There are no discrete nodules identified within the thyroid isthmus.

Lymphadenopathy

None visualized.
IMPRESSION: 1. Nonspecific mild heterogeneity within a borderline enlarged
thyroid gland.
2. Bilateral thyroid nodules as above. The dominant approximately
2.4 cm nodule within the inferior aspect the right lobe of the
thyroid meets imaging criteria to recommend percutaneous sampling as
clinically indicated. This recommendation follows the consensus
statement: Management of Thyroid Nodules Detected at US: Society of
Radiologists in Ultrasound Consensus Conference Statement. Radiology

## 2016-06-29 ENCOUNTER — Encounter (HOSPITAL_COMMUNITY): Payer: Self-pay | Admitting: Emergency Medicine

## 2016-06-29 ENCOUNTER — Emergency Department (HOSPITAL_COMMUNITY): Payer: Managed Care, Other (non HMO)

## 2016-06-29 ENCOUNTER — Emergency Department (HOSPITAL_COMMUNITY)
Admission: EM | Admit: 2016-06-29 | Discharge: 2016-06-29 | Disposition: A | Discharge status: Home / Self Care | Payer: Managed Care, Other (non HMO) | Attending: Emergency Medicine | Admitting: Emergency Medicine

## 2016-06-29 DIAGNOSIS — I10 Essential (primary) hypertension: Secondary | ICD-10-CM | POA: Diagnosis not present

## 2016-06-29 DIAGNOSIS — Z79899 Other long term (current) drug therapy: Secondary | ICD-10-CM | POA: Insufficient documentation

## 2016-06-29 DIAGNOSIS — R1084 Generalized abdominal pain: Secondary | ICD-10-CM

## 2016-06-29 DIAGNOSIS — R109 Unspecified abdominal pain: Secondary | ICD-10-CM | POA: Diagnosis present

## 2016-06-29 LAB — URINALYSIS, ROUTINE W REFLEX MICROSCOPIC
Bilirubin Urine: NEGATIVE
Glucose, UA: NEGATIVE mg/dL
Ketones, ur: NEGATIVE mg/dL
Leukocytes, UA: NEGATIVE
NITRITE: NEGATIVE
PROTEIN: NEGATIVE mg/dL
SPECIFIC GRAVITY, URINE: 1.021 (ref 1.005–1.030)
pH: 5 (ref 5.0–8.0)

## 2016-06-29 LAB — COMPREHENSIVE METABOLIC PANEL
ALBUMIN: 3.6 g/dL (ref 3.5–5.0)
ALT: 15 U/L (ref 14–54)
ANION GAP: 7 (ref 5–15)
AST: 18 U/L (ref 15–41)
Alkaline Phosphatase: 63 U/L (ref 38–126)
BILIRUBIN TOTAL: 0.7 mg/dL (ref 0.3–1.2)
BUN: 9 mg/dL (ref 6–20)
CO2: 22 mmol/L (ref 22–32)
Calcium: 8.7 mg/dL — ABNORMAL LOW (ref 8.9–10.3)
Chloride: 106 mmol/L (ref 101–111)
Creatinine, Ser: 0.7 mg/dL (ref 0.44–1.00)
GFR calc Af Amer: >60 mL/min (ref >60)
GFR calc non Af Amer: >60 mL/min (ref >60)
GLUCOSE: 138 mg/dL — AB (ref 65–99)
POTASSIUM: 4.1 mmol/L (ref 3.5–5.1)
SODIUM: 135 mmol/L (ref 135–145)
TOTAL PROTEIN: 7.6 g/dL (ref 6.5–8.1)

## 2016-06-29 LAB — CBC
HEMATOCRIT: 39 % (ref 36.0–46.0)
HEMOGLOBIN: 13.4 g/dL (ref 12.0–15.0)
MCH: 28.6 pg (ref 26.0–34.0)
MCHC: 34.4 g/dL (ref 30.0–36.0)
MCV: 83.2 fL (ref 78.0–100.0)
Platelets: 254 10*3/uL (ref 150–400)
RBC: 4.69 MIL/uL (ref 3.87–5.11)
RDW: 13.7 % (ref 11.5–15.5)
WBC: 4.8 10*3/uL (ref 4.0–10.5)

## 2016-06-29 LAB — LIPASE, BLOOD: Lipase: 17 U/L (ref 11–51)

## 2016-06-29 LAB — I-STAT CG4 LACTIC ACID, ED: LACTIC ACID, VENOUS: 1.74 mmol/L (ref 0.5–1.9)

## 2016-06-29 MED ORDER — ONDANSETRON HCL 4 MG/2ML IJ SOLN
4.0000 mg | Freq: Once | INTRAMUSCULAR | Status: AC
  Administered 2016-06-29: 4 mg via INTRAVENOUS
  Filled 2016-06-29: qty 2

## 2016-06-29 MED ORDER — ONDANSETRON HCL 4 MG PO TABS: 4.0000 mg | ORAL_TABLET | Freq: Three times a day (TID) | ORAL | 0 refills | Status: DC | PRN

## 2016-06-29 MED ORDER — HYDROCODONE-ACETAMINOPHEN 5-325 MG PO TABS: 1.0000 | ORAL_TABLET | Freq: Four times a day (QID) | ORAL | 0 refills | Status: DC | PRN

## 2016-06-29 MED ORDER — MORPHINE SULFATE (PF) 4 MG/ML IV SOLN
4.0000 mg | Freq: Once | INTRAVENOUS | Status: AC
  Administered 2016-06-29: 4 mg via INTRAVENOUS
  Filled 2016-06-29: qty 1

## 2016-06-29 MED ORDER — IOPAMIDOL (ISOVUE-300) INJECTION 61%
100.0000 mL | Freq: Once | INTRAVENOUS | Status: AC | PRN
  Administered 2016-06-29: 100 mL via INTRAVENOUS

## 2016-06-29 MED ORDER — IOPAMIDOL (ISOVUE-300) INJECTION 61%
INTRAVENOUS | Status: AC
  Filled 2016-06-29: qty 100

## 2016-06-29 MED ORDER — SODIUM CHLORIDE 0.9 % IV BOLUS (SEPSIS)
1000.0000 mL | Freq: Once | INTRAVENOUS | Status: AC
  Administered 2016-06-29: 1000 mL via INTRAVENOUS

## 2016-06-29 NOTE — ED Provider Notes (Signed)
[facility]-EMERGENCY DEPT Provider Note   CSN: [id number] Arrival date & time: 06/29/16  4098     History   Chief Complaint Chief Complaint  Patient presents with  . Abdominal Pain    HPI Amy [name] is a 46 y.o. female with a past medical history significant for hypertension and diabetes who presents with right-sided abdominal pain. Patient reports that she was feeling normal last night going to bed but woke up 3 and this morning with severe pain. She says the pain started after she had a small bowel movement and urinated. She denied any constipation, diarrhea, or dysuria. She denies any hematuria. She denies any history of gallbladder disease, appendix problems, or kidney stones. She says that she has had a total hysterectomy and this does not feel like prior menstrual cramping. He describes the pain as 9 out of 10 severity, sharp, and on the side of her abdomen. She says it did hurt worse with palpation and any movement. She denies any vomiting reports significant nausea. She describes the pain is constant. She denies any other symptoms specifically, no fevers, rhinorrhea, cough, congestion, chest pain, or shortness of breath. She does report some chills.    HPI  Past Medical History:  Diagnosis Date  . Anemia   . Diabetes mellitus without complication (HCC)    PRE-DIABETIC, NO LONGER NEED MEDICATION  . Essential hypertension 09/28/2015  . Hypertension   . Leg swelling   . PONV (postoperative nausea and vomiting)   . Pre-syncope 09/28/2015  . PVC (premature ventricular contraction) 09/28/2015  . Vitamin D deficiency     Patient Active Problem List   Diagnosis Date Noted  . Pre-syncope 09/28/2015  . Essential hypertension 09/28/2015  . PVC (premature ventricular contraction) 09/28/2015  . S/P total abdominal hysterectomy 03/27/2015    Class: Status post  . S/P TAH (total abdominal hysterectomy) 03/27/2015    Past Surgical History:  Procedure Laterality Date  . ABDOMINAL  HYSTERECTOMY Bilateral 03/27/2015   Procedure: HYSTERECTOMY ABDOMINAL WITH BILATERAL SALPINGECTOMY;  Surgeon: Richardean Chimera, MD;  Location: [facility];  Service: Gynecology;  Laterality: Bilateral;  . CESAREAN SECTION    . LAPAROSCOPIC VAGINAL HYSTERECTOMY WITH SALPINGECTOMY Bilateral 03/27/2015   Procedure: ATTEMPTED LAPAROSCOPIC ASSISTED VAGINAL HYSTERECTOMY WITH BILATERAL SALPINGECTOMY;  Surgeon: Richardean Chimera, MD;  Location: [facility];  Service: Gynecology;  Laterality: Bilateral;  . TUBAL LIGATION    . WISDOM TOOTH EXTRACTION      OB History    No data available       Home Medications    Prior to Admission medications   Medication Sig Start Date End Date Taking? Authorizing Provider  Cholecalciferol (VITAMIN D3) 10000 units capsule Take 10,000 Units by mouth daily.    Historical Provider, MD  hydrochlorothiazide (MICROZIDE) 12.5 MG capsule Take 1 capsule (12.5 mg total) by mouth daily as needed (FOR SWELLING OR ELEVATED BLOOD PRESSURE). 10/19/15   Chilton Si, MD  ibuprofen (ADVIL,MOTRIN) 200 MG tablet Take 200 mg by mouth as needed.    Historical Provider, MD  Multiple Vitamin (MULTIVITAMIN WITH MINERALS) TABS tablet Take 1 tablet by mouth daily after breakfast.    Historical Provider, MD    Family History Family History  Problem Relation Age of Onset  . Hypertension Mother   . Diabetes Mother   . Dementia Father   . Diabetes Sister   . Hypertension Sister   . Hypertension Maternal Grandmother   . Arthritis Maternal Grandmother   . Diabetes Paternal Grandmother   . Stroke  Paternal Grandmother   . Hypertension Sister     Social History Social History  Substance Use Topics  . Smoking status: Never Smoker  . Smokeless tobacco: Never Used  . Alcohol use Yes     Comment: occasionally     Allergies   Lisinopril   Review of Systems Review of Systems  Constitutional: Positive for chills. Negative for diaphoresis, fatigue and fever.  HENT: Negative for congestion and  rhinorrhea.   Respiratory: Negative for cough, chest tightness, shortness of breath, wheezing and stridor.   Cardiovascular: Negative for chest pain and palpitations.  Gastrointestinal: Positive for abdominal pain and nausea. Negative for abdominal distention, diarrhea and vomiting.  Genitourinary: Negative for dysuria, frequency and hematuria.  Musculoskeletal: Negative for back pain, neck pain and neck stiffness.  Neurological: Negative for syncope and headaches.  Psychiatric/Behavioral: Negative for agitation.  All other systems reviewed and are negative.    Physical Exam Updated Vital Signs BP 156/86   Pulse 83   Temp 98.9 F (37.2 C) (Oral)   Resp 16   Wt (!) 330 lb (149.7 kg)   SpO2 100%   BMI 51.69 kg/m   Physical Exam  Constitutional: She appears well-developed and well-nourished. No distress.  HENT:  Head: Normocephalic and atraumatic.  Mouth/Throat: Oropharynx is clear and moist. No oropharyngeal exudate.  Eyes: Conjunctivae are normal. Pupils are equal, round, and reactive to light.  Neck: Normal range of motion. Neck supple.  Cardiovascular: Normal rate and regular rhythm.   No murmur heard. Pulmonary/Chest: Effort normal and breath sounds normal. No stridor. No respiratory distress. She has no wheezes. She exhibits no tenderness.  Abdominal: Soft. Normal appearance. There is tenderness in the right upper quadrant and right lower quadrant. There is no rigidity, no rebound and no CVA tenderness.    Musculoskeletal: She exhibits no edema or tenderness.  Neurological: She is alert. No sensory deficit. She exhibits normal muscle tone.  Skin: Skin is warm and dry. Capillary refill takes less than 2 seconds. No rash noted. No erythema.  Psychiatric: She has a normal mood and affect.  Nursing note and vitals reviewed.    ED Treatments / Results  Labs (all labs ordered are listed, but only abnormal results are displayed) Labs Reviewed  COMPREHENSIVE METABOLIC  PANEL - Abnormal; Notable for the following:       Result Value   Glucose, Bld 138 (*)    Calcium 8.7 (*)    All other components within normal limits  URINALYSIS, ROUTINE W REFLEX MICROSCOPIC - Abnormal; Notable for the following:    Hgb urine dipstick SMALL (*)    Bacteria, UA MANY (*)    Squamous Epithelial / LPF 0-5 (*)    All other components within normal limits  URINE CULTURE  LIPASE, BLOOD  CBC  I-STAT CG4 LACTIC ACID, ED  I-STAT CG4 LACTIC ACID, ED    EKG  EKG Interpretation None       Radiology Ct Abdomen Pelvis W Contrast  Result Date: 06/29/2016 CLINICAL DATA:  Right abdominal pain, sudden onset of this morning. Nausea. Prior hysterectomy. EXAM: CT ABDOMEN AND PELVIS WITH CONTRAST TECHNIQUE: Multidetector CT imaging of the abdomen and pelvis was performed using the standard protocol following bolus administration of intravenous contrast. CONTRAST:  ISOVUE-300 IOPAMIDOL (ISOVUE-300) INJECTION 61% COMPARISON:  No prior CT abdomen/ pelvis. 07/11/2014 chest CT angiogram. FINDINGS: Lower chest: No significant pulmonary nodules or acute consolidative airspace disease. Hepatobiliary: Normal liver size. Hypodense 0.5 cm right liver lobe lesion, too  small to characterize, for which no further follow-up is required. No additional liver lesions. Normal gallbladder with no radiopaque cholelithiasis. No biliary ductal dilatation. Pancreas: Normal, with no mass or duct dilation. Spleen: Normal size. No mass. Adrenals/Urinary Tract: Normal adrenals. Normal kidneys with no hydronephrosis and no renal mass. Normal bladder. Stomach/Bowel: Grossly normal stomach. Normal caliber small bowel with no small bowel wall thickening. Normal appendix. Normal large bowel with no diverticulosis, large bowel wall thickening or pericolonic fat stranding. Vascular/Lymphatic: Normal caliber abdominal aorta. Patent portal, splenic, hepatic and renal veins. No pathologically enlarged lymph nodes in the  abdomen or pelvis. Reproductive: Status post hysterectomy, with no abnormal findings at the vaginal cuff. Simple appearing 2.1 cm right ovarian cyst (series 2/ image 63). No additional adnexal lesions. Other: No pneumoperitoneum, ascites or focal fluid collection. Musculoskeletal: No aggressive appearing focal osseous lesions. Moderate thoracolumbar spondylosis. IMPRESSION: 1. Simple appearing 2.1 cm right ovarian cyst, for which no pelvic sonographic follow-up is required, unless as otherwise clinically warranted. This recommendation follows ACR consensus guidelines: White Paper of the ACR Incidental Findings Committee II on Adnexal Findings. J Am Coll Radiol 2013:10:675-681. 2. Otherwise no acute abnormality. No evidence of bowel obstruction or acute bowel inflammation. Normal appendix. Electronically Signed   By: Delbert Phenix M.D.   On: 06/29/2016 10:13    Procedures Procedures (including critical care time)  Medications Ordered in ED Medications  sodium chloride 0.9 % bolus 1,000 mL (0 mLs Intravenous Stopped 06/29/16 1035)  ondansetron (ZOFRAN) injection 4 mg (4 mg Intravenous Given 06/29/16 0853)  morphine 4 MG/ML injection 4 mg (4 mg Intravenous Given 06/29/16 0853)  iopamidol (ISOVUE-300) 61 % injection 100 mL (100 mLs Intravenous Contrast Given 06/29/16 0953)     Initial Impression / Assessment and Plan / ED Course  I have reviewed the triage vital signs and the nursing notes.  Pertinent labs & imaging results that were available during my care of the patient were reviewed by me and considered in my medical decision making (see chart for details).     KORTNI HASTEN is a 46 y.o. female with a past medical history significant for hypertension and diabetes who presents with right-sided abdominal pain.  History and exam are seen above.   On exam, patient has tenderness in her entire right side abdomen. Patient has pain in her right upper quadrant in her right lower quadrant. Patient has no  left-sided tenderness. Patient's back nontender with no CVA tenderness. Legs are nonedematous and nontender. Lungs are clear and chest is nontender.  Given the description of pain and location of discomfort with tenderness, suspect either a gallbladder or appendix cause of pain. Patient also instructed that constipation could be the source given relation to bowel movement. Patient will be given pain medicine, nausea medicine, and fluids while laboratory testing and imaging is collected. Anticipate reassessment following workup.  Diagnostic workup results are seen above. Patient had normal lipase, normal lactic acid, CMP showed no abnormality. No evidence of kidney function problems or liver function laterality. No evidence of urinary tract infection. CBC showed no anemia or leukocytosis.  CT scan showed evidence of small right ovarian cyst with no evidence of obstruction, appendicitis, or gallbladder disease.  Patient reassured of workup results. Patient was feeling much better after pain and nausea medication. Patient also advised that there was a moderate amount of stool located in the area of pain. Suspect that constipation may contributed to her discomfort especially since this started with a bowel movement.  Patient will follow with her PCP for further management and understood return precautions for any new or worsening symptoms. Patient was complaining for pain medication and nausea medication. Patient discharged in good condition with significant improvement in symptoms.   Final Clinical Impressions(s) / ED Diagnoses   Final diagnoses:  Generalized abdominal pain    New Prescriptions Discharge Medication List as of 06/29/2016 12:07 PM    START taking these medications   Details  HYDROcodone-acetaminophen (NORCO/VICODIN) 5-325 MG tablet Take 1 tablet by mouth every 6 (six) hours as needed., Starting Sat 06/29/2016, Print    ondansetron (ZOFRAN) 4 MG tablet Take 1 tablet (4 mg total)  by mouth every 8 (eight) hours as needed for nausea or vomiting., Starting Sat 06/29/2016, Print        Clinical Impression: 1. Generalized abdominal pain     Disposition: Discharge  Condition: Good  I have discussed the results, Dx and Tx plan with the pt(& family if present). He/she/they expressed understanding and agree(s) with the plan. Discharge instructions discussed at great length. Strict return precautions discussed and pt &/or family have verbalized understanding of the instructions. No further questions at time of discharge.    Discharge Medication List as of 06/29/2016 12:07 PM    START taking these medications   Details  HYDROcodone-acetaminophen (NORCO/VICODIN) 5-325 MG tablet Take 1 tablet by mouth every 6 (six) hours as needed., Starting Sat 06/29/2016, Print    ondansetron (ZOFRAN) 4 MG tablet Take 1 tablet (4 mg total) by mouth every 8 (eight) hours as needed for nausea or vomiting., Starting Sat 06/29/2016, Print        Follow Up: Farris HasAaron Morrow, MD [address] [place] [address] [phone]     [facility] [address] [id number] [address] [id number] [phone]  If symptoms worsen     Heide Scaleshristopher J Sundy Houchins, MD 06/30/16 [phone]

## 2016-06-29 NOTE — Discharge Instructions (Signed)
Please take the pain medicine and nausea medicine as needed to help with her symptoms. Please follow-up with a primary care physician for further management. Any symptoms acutely worsen, please return to the nearest margin.

## 2016-06-29 NOTE — ED Triage Notes (Signed)
Pt reports she began to have RLQ pain after urinating at 0300 this am. Slightly nauseated. No v/d, dysuria, or hematuria. LBM today before pain began.

## 2016-06-29 NOTE — ED Notes (Signed)
Pt ambulated to the bathroom.  

## 2016-06-30 LAB — URINE CULTURE

## 2016-08-15 ENCOUNTER — Other Ambulatory Visit: Payer: Self-pay | Admitting: Family Medicine

## 2016-08-15 DIAGNOSIS — E041 Nontoxic single thyroid nodule: Secondary | ICD-10-CM

## 2016-09-04 ENCOUNTER — Ambulatory Visit
Admission: RE | Admit: 2016-09-04 | Discharge: 2016-09-04 | Disposition: A | Discharge status: Home / Self Care | Payer: Managed Care, Other (non HMO) | Source: Ambulatory Visit | Attending: Family Medicine | Admitting: Family Medicine

## 2016-09-04 DIAGNOSIS — E041 Nontoxic single thyroid nodule: Secondary | ICD-10-CM

## 2017-09-03 ENCOUNTER — Other Ambulatory Visit: Payer: Self-pay | Admitting: Family Medicine

## 2017-09-03 DIAGNOSIS — E041 Nontoxic single thyroid nodule: Secondary | ICD-10-CM

## 2018-02-12 ENCOUNTER — Other Ambulatory Visit: Payer: Self-pay | Admitting: Family Medicine

## 2018-02-12 DIAGNOSIS — E041 Nontoxic single thyroid nodule: Secondary | ICD-10-CM

## 2018-02-24 ENCOUNTER — Ambulatory Visit
Admission: RE | Admit: 2018-02-24 | Discharge: 2018-02-24 | Disposition: A | Discharge status: Home / Self Care | Payer: Managed Care, Other (non HMO) | Source: Ambulatory Visit | Attending: Family Medicine | Admitting: Family Medicine

## 2018-02-24 DIAGNOSIS — E041 Nontoxic single thyroid nodule: Secondary | ICD-10-CM

## 2018-04-02 ENCOUNTER — Encounter (INDEPENDENT_AMBULATORY_CARE_PROVIDER_SITE_OTHER): Payer: Self-pay

## 2018-05-11 IMAGING — CT CT ABD-PELV W/ CM
2 of 5 series · 16 of 46 positions shown, 18 images · IV contrast (iopamidol)
Comparison: No prior CT abdomen/ pelvis. 07/11/2014 chest CT
angiogram.

CLINICAL DATA: Right abdominal pain, sudden onset of this morning.
Nausea. Prior hysterectomy.

EXAM:
CT ABDOMEN AND PELVIS WITH CONTRAST
TECHNIQUE: Multidetector CT imaging of the abdomen and pelvis was performed
using the standard protocol following bolus administration of
intravenous contrast.
CONTRAST:  100mL YJAWIU-BPP IOPAMIDOL (YJAWIU-BPP) INJECTION 61%

[Series 2: abd/pel with · axial · 0.98mm/px · z∈[-486,-86]mm · 13 of 94 slices shown, 15 images]
[im 7/94  soft-tissue]
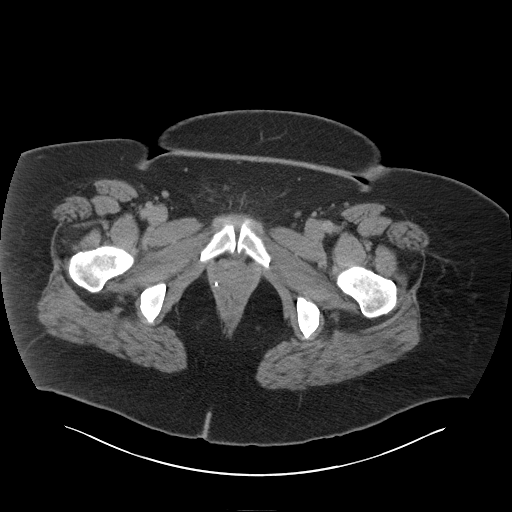
[im 7/94  bone]
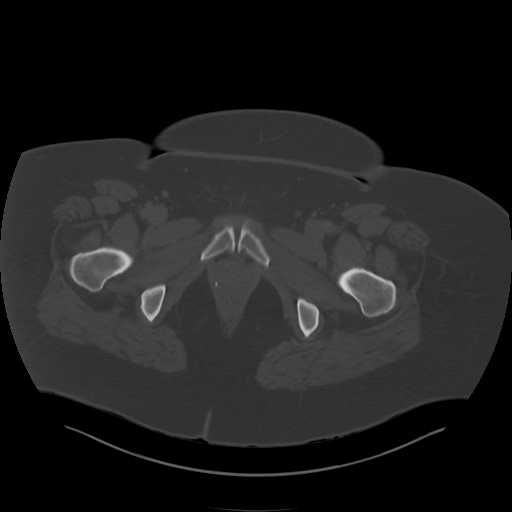
[im 14/94  soft-tissue]
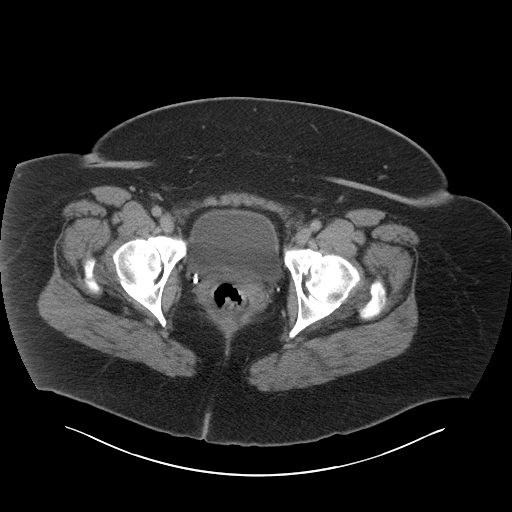
[im 20/94  soft-tissue]
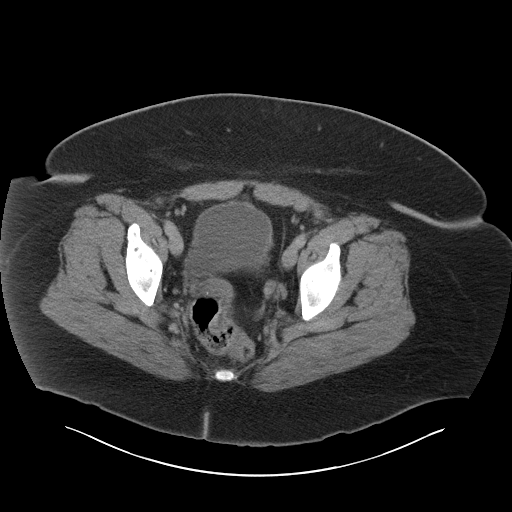
[im 27/94  soft-tissue]
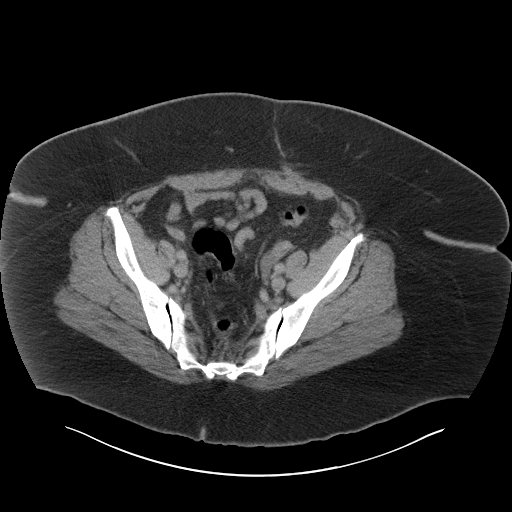
[im 34/94  soft-tissue]
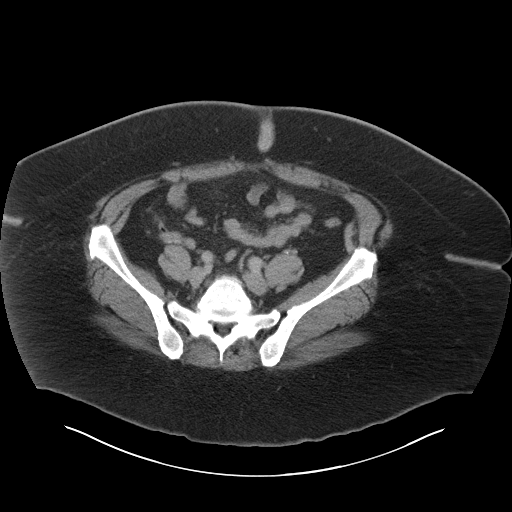
[im 40/94  soft-tissue]
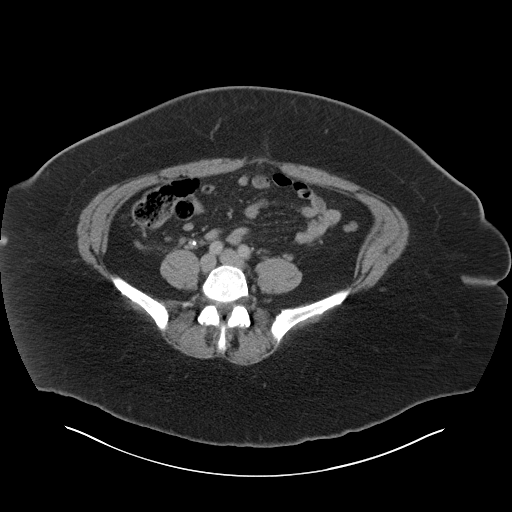
[im 47/94  soft-tissue]
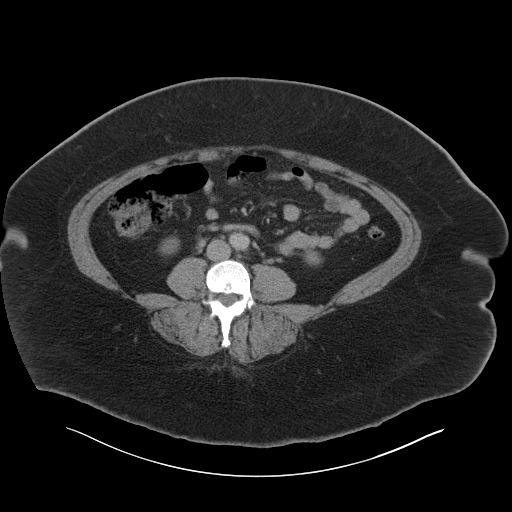
[im 54/94  soft-tissue]
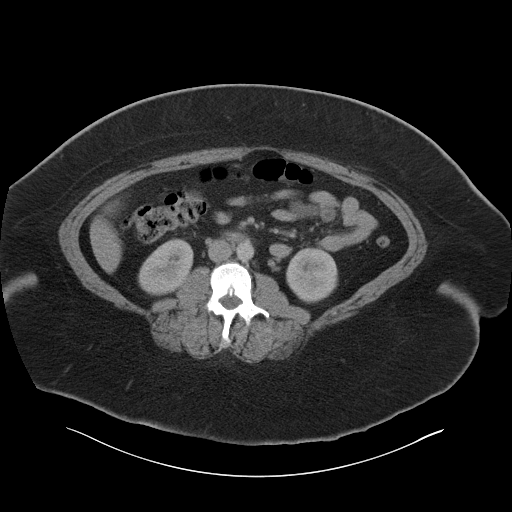
[im 60/94  soft-tissue]
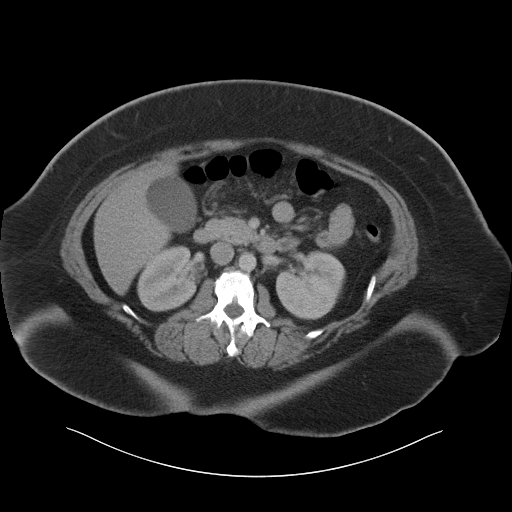
[im 60/94  bone]
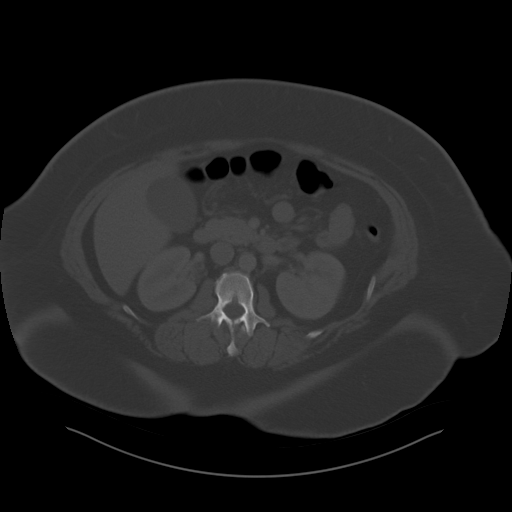
[im 67/94  soft-tissue]
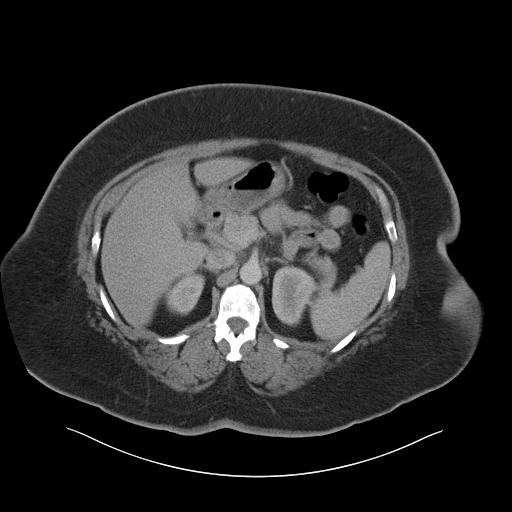
[im 74/94  soft-tissue]
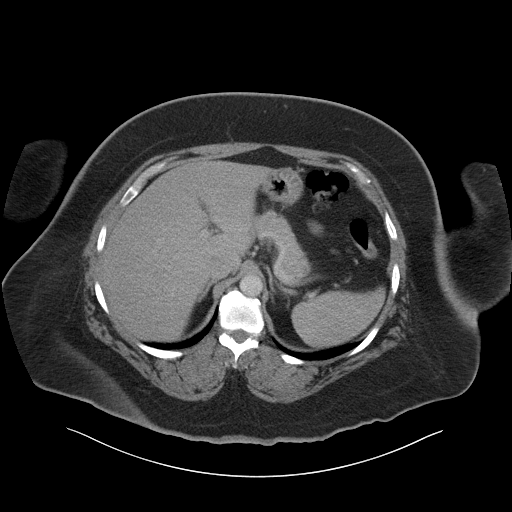
[im 80/94  soft-tissue]
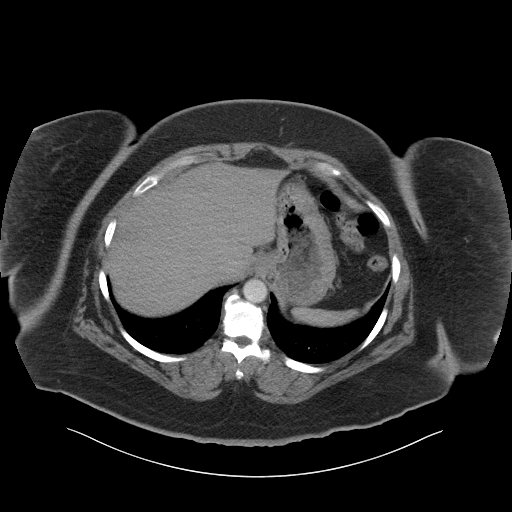
[im 87/94  soft-tissue]
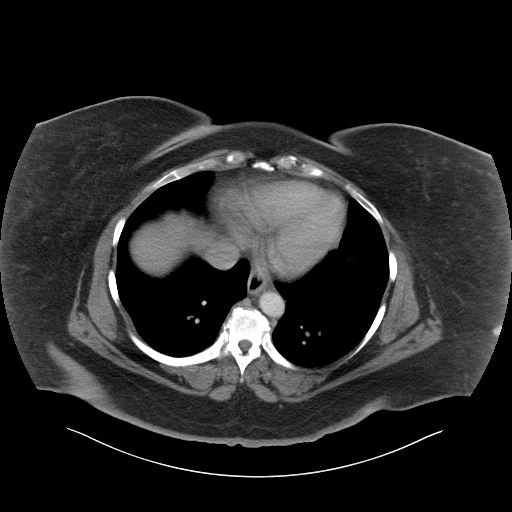

[Series 5: coronal a/|p · coronal · 0.91mm/px · 3 of 172 slices shown]
[im 58/172  soft-tissue]
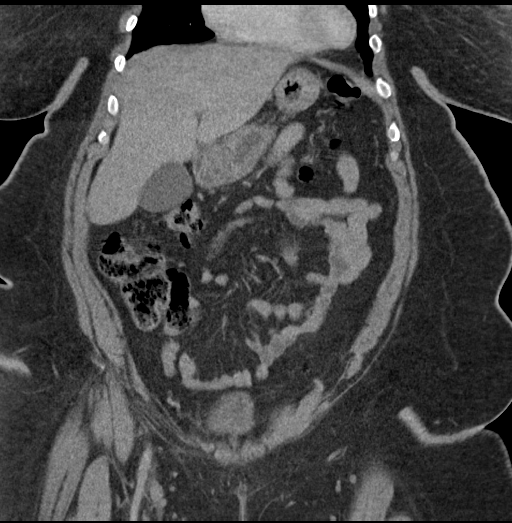
[im 77/172  soft-tissue]
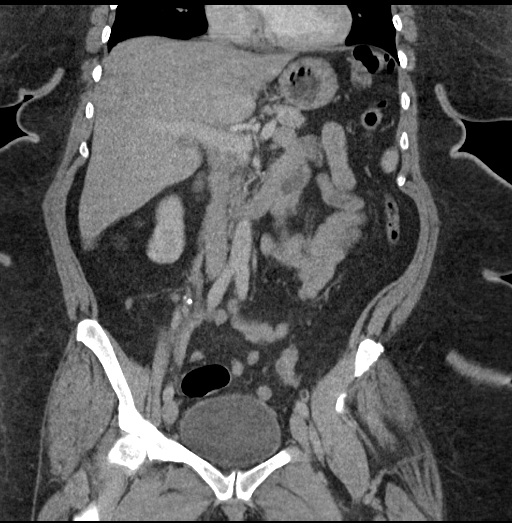
[im 96/172  soft-tissue]
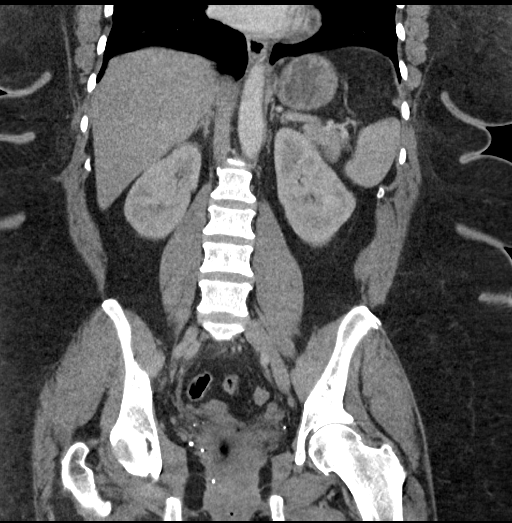

[16 of 46 positions shown; findings below may reference images not displayed]

FINDINGS: Lower chest: No significant pulmonary nodules or acute consolidative
airspace disease.

Hepatobiliary: Normal liver size. Hypodense 0.5 cm right liver lobe
lesion, too small to characterize, for which no further follow-up is
required. No additional liver lesions. Normal gallbladder with no
radiopaque cholelithiasis. No biliary ductal dilatation.

Pancreas: Normal, with no mass or duct dilation.

Spleen: Normal size. No mass.

Adrenals/Urinary Tract: Normal adrenals. Normal kidneys with no
hydronephrosis and no renal mass. Normal bladder.

Stomach/Bowel: Grossly normal stomach. Normal caliber small bowel
with no small bowel wall thickening. Normal appendix. Normal large
bowel with no diverticulosis, large bowel wall thickening or
pericolonic fat stranding.

Vascular/Lymphatic: Normal caliber abdominal aorta. Patent portal,
splenic, hepatic and renal veins. No pathologically enlarged lymph
nodes in the abdomen or pelvis.

Reproductive: Status post hysterectomy, with no abnormal findings at
the vaginal cuff. Simple appearing 2.1 cm right ovarian cyst (series
2/ image 63). No additional adnexal lesions.

Other: No pneumoperitoneum, ascites or focal fluid collection.

Musculoskeletal: No aggressive appearing focal osseous lesions.
Moderate thoracolumbar spondylosis.
IMPRESSION: 1. Simple appearing 2.1 cm right ovarian cyst, for which no pelvic
sonographic follow-up is required, unless as otherwise clinically
warranted. This recommendation follows ACR consensus guidelines:
White Paper of the ACR Incidental Findings Committee II on Adnexal
Findings. [HOSPITAL] [DATE].
2. Otherwise no acute abnormality. No evidence of bowel obstruction
or acute bowel inflammation. Normal appendix.

## 2018-06-11 IMAGING — US US THYROID
1 series · 13 of 25 positions shown · non-contrast
Comparison: Prior thyroid ultrasound 08/02/2014; prior thyroid
biopsy images 3 08/07/2014

CLINICAL DATA: Prior ultrasound follow-up. 45-year-old female with
bilateral thyroid nodules. She has undergone a biopsy of the right
inferior thyroid nodule on 08/10/2014

EXAM:
THYROID ULTRASOUND
TECHNIQUE: Ultrasound examination of the thyroid gland and adjacent soft
tissues was performed.

[Series 1: us thyroid · 0.06mm/px · 13 of 38 slices shown]
[im 1/38]
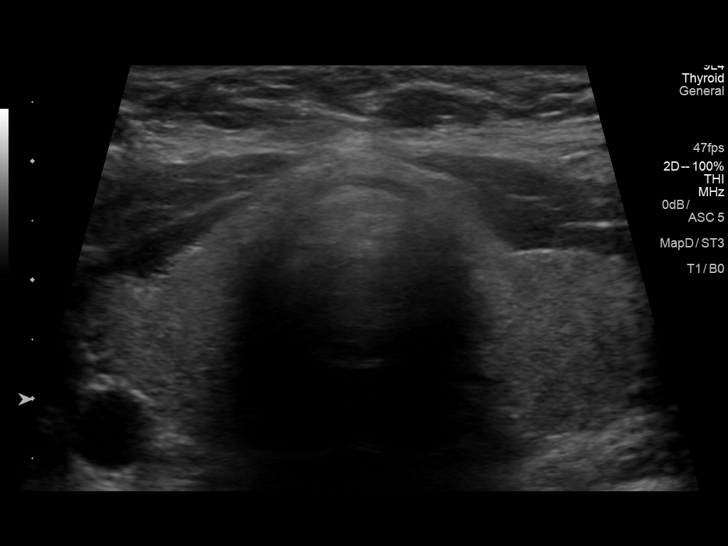
[im 4/38]
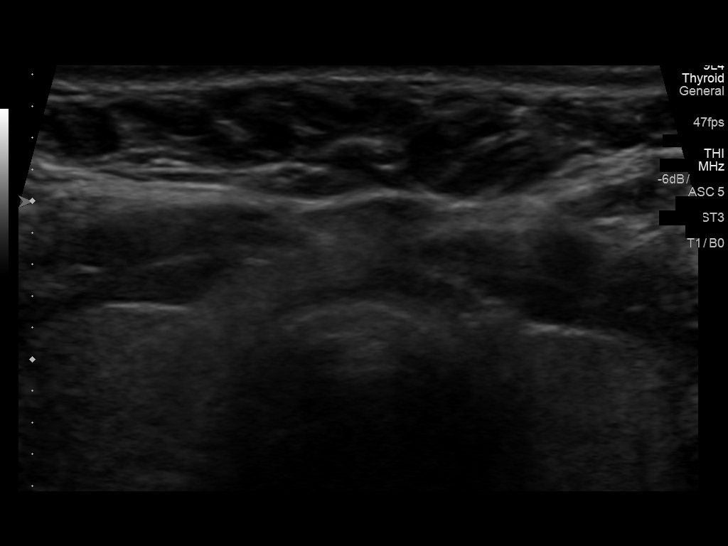
[im 7/38]
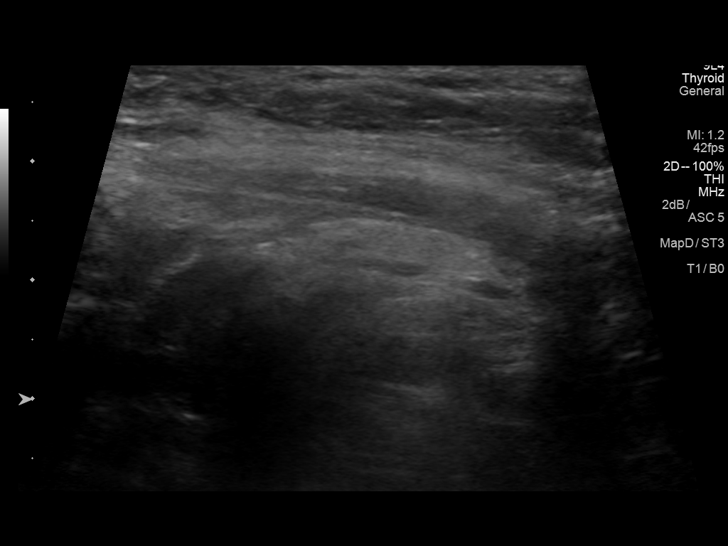
[im 10/38]
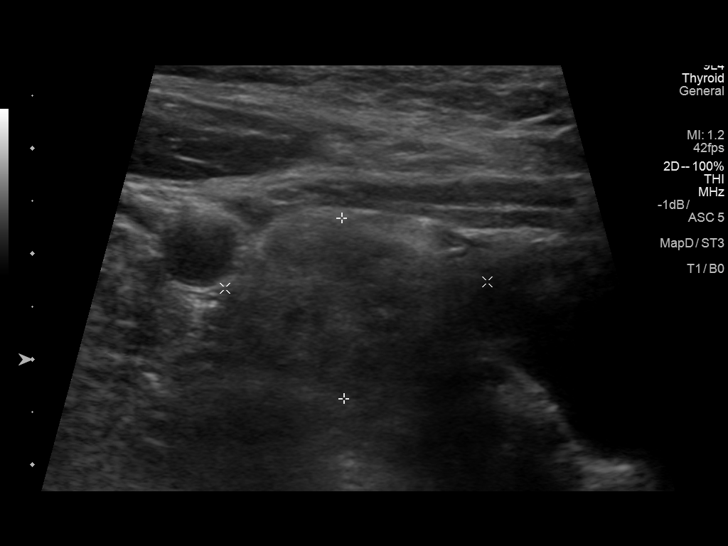
[im 13/38]
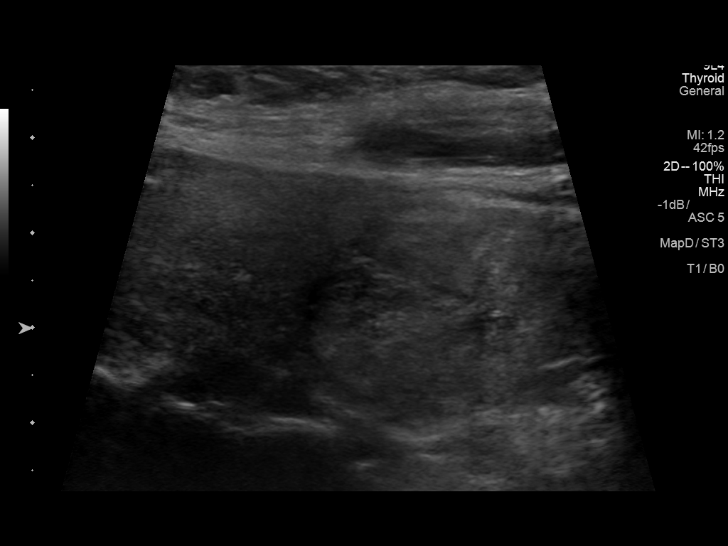
[im 16/38]
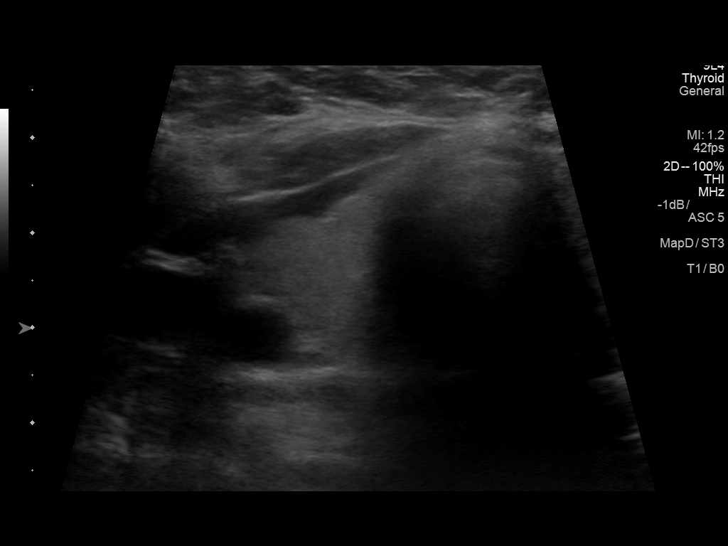
[im 19/38]
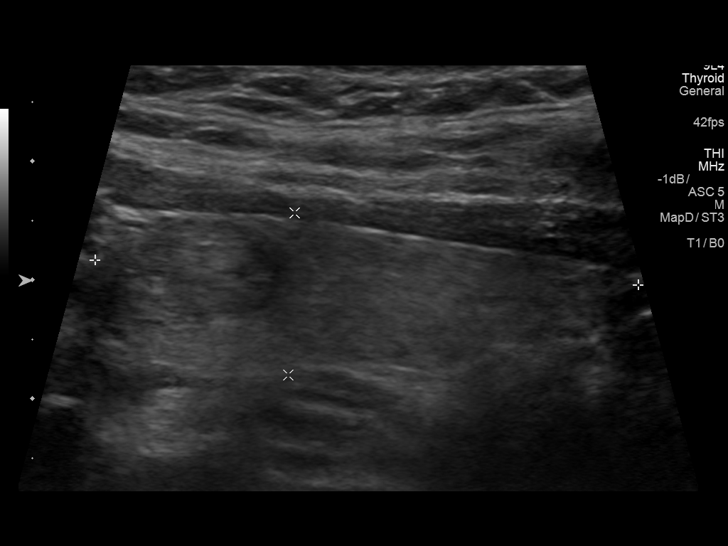
[im 22/38]
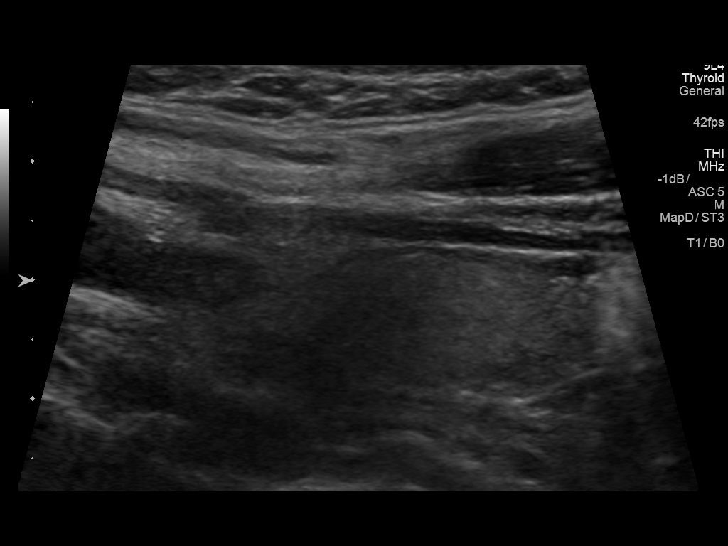
[im 25/38]
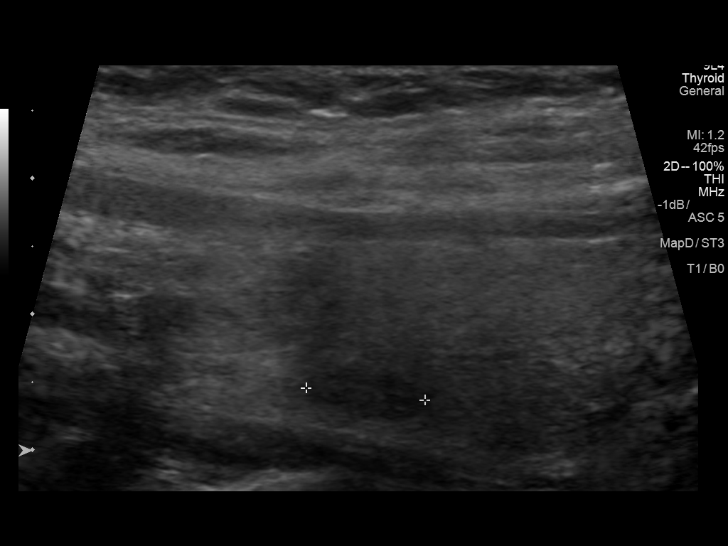
[im 28/38]
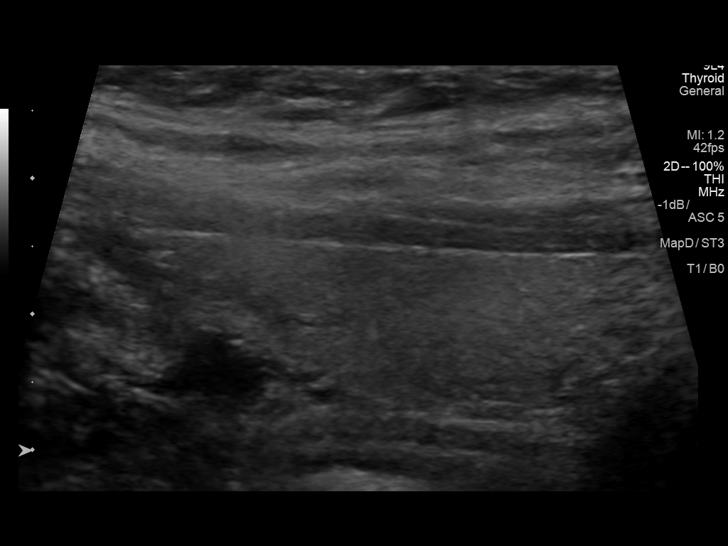
[im 31/38]
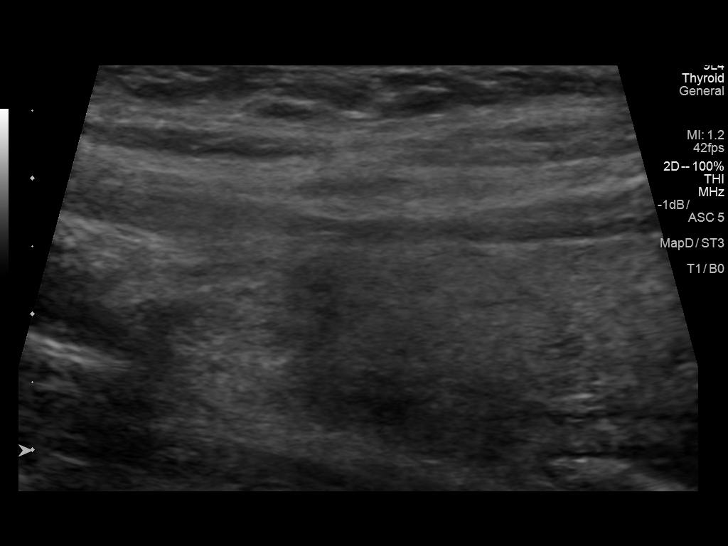
[im 34/38]
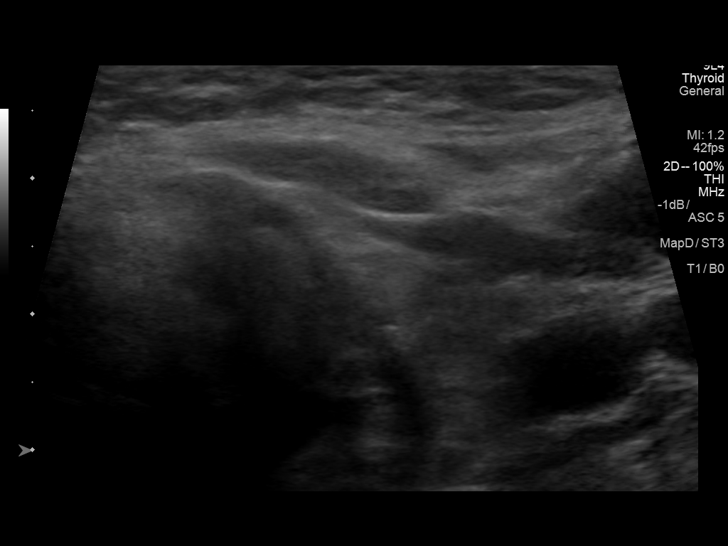
[im 38/38]
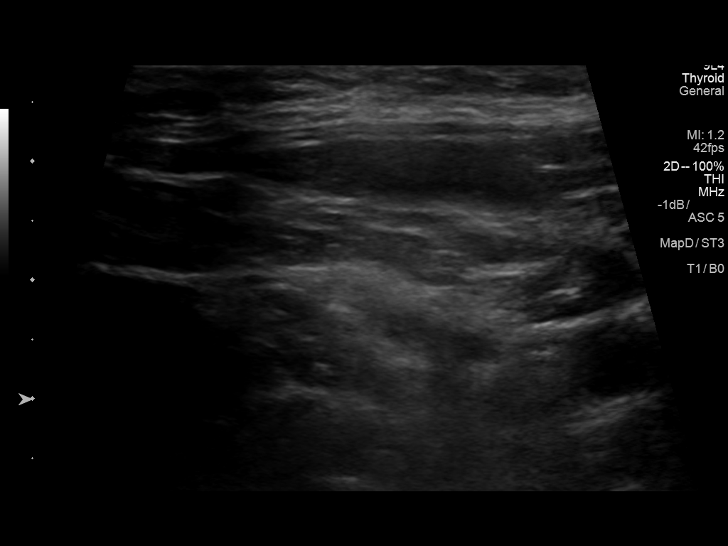

[13 of 25 positions shown; findings below may reference images not displayed]

FINDINGS: Parenchymal Echotexture: Normal

Isthmus: 0.4 cm

Right lobe: 5.0 x 2.8 x 1.7 cm

Left lobe: 4.6 x 1.4 x 1.7 cm

_________________________________________________________

Estimated total number of nodules >/= 1 cm: 2

Number of spongiform nodules >/=  2 cm not described below (TR1): 0

Number of mixed cystic and solid nodules >/= 1.5 cm not described
below (TR2): 0

_________________________________________________________

Nodule # 2:

Prior biopsy: No

Location: Left; Superior

Maximum size: 1.2 cm; Other 2 dimensions: 0.8 x 1.1 cm, previously,
1.2 x 0.8 x 1.1 cm

Composition: solid/almost completely solid (2)

Echogenicity: hypoechoic (2)

Shape: not taller-than-wide (0)

Margins: ill-defined (0)

Echogenic foci: none (0)

ACR TI-RADS total points: 4.

ACR TI-RADS risk category:  TR4 (4-6 points).

Significant change in size (>/= 20% in two dimensions and minimal
increase of 2 mm): No

Change in features: No

Change in ACR TI-RADS risk category: No

ACR TI-RADS recommendations:

*Given size (>/= 1 - 1.4 cm) and appearance, a follow-up ultrasound
in 1 year should be considered based on TI-RADS criteria.

_________________________________________________________

The previously biopsied 2.4 cm nodule in the right lower gland
demonstrates no significant interval change measuring 2.4 x 1.7 x
1.8 cm compared to 2.4 x 1.8 x 1.5 cm previously. There is a small
hypoechoic nodule in the posterior aspect of the left mid gland
which measures up to 0.9 cm. This nodule does not meet criteria for
dedicated imaging follow-up or biopsy.
IMPRESSION: 1. Stable previously biopsied nodule in the right inferior gland.
2. Confirmed 2 year stability of a TI-RADS 4 nodule in the left
upper gland. This lesion meets criteria for continued annual
sonographic surveillance until 5 year stability has been confirmed.
The above is in keeping with the ACR TI-RADS recommendations - [HOSPITAL] 6275;[DATE].

## 2018-10-22 ENCOUNTER — Other Ambulatory Visit (HOSPITAL_COMMUNITY): Payer: Self-pay | Admitting: Family Medicine

## 2018-10-22 ENCOUNTER — Other Ambulatory Visit: Payer: Self-pay | Admitting: Family Medicine

## 2018-10-22 ENCOUNTER — Ambulatory Visit (HOSPITAL_COMMUNITY): Admission: RE | Admit: 2018-10-22 | Payer: 59 | Source: Ambulatory Visit

## 2018-10-22 DIAGNOSIS — M79604 Pain in right leg: Secondary | ICD-10-CM

## 2018-10-22 DIAGNOSIS — R609 Edema, unspecified: Secondary | ICD-10-CM

## 2018-12-23 ENCOUNTER — Other Ambulatory Visit: Payer: Self-pay

## 2018-12-23 DIAGNOSIS — Z20822 Contact with and (suspected) exposure to covid-19: Secondary | ICD-10-CM

## 2018-12-25 LAB — NOVEL CORONAVIRUS, NAA: SARS-CoV-2, NAA: DETECTED — AB

## 2019-07-24 ENCOUNTER — Ambulatory Visit: Payer: 59 | Attending: Internal Medicine

## 2019-07-24 DIAGNOSIS — Z23 Encounter for immunization: Secondary | ICD-10-CM | POA: Insufficient documentation

## 2019-07-24 NOTE — Progress Notes (Signed)
   Covid-19 Vaccination Clinic  Name:  Amy [name]    MRN: [id number] DOB: Jun 12, 1970  07/24/2019  Amy [name] was observed post Covid-19 immunization for 15 minutes without incident. She was provided with Vaccine Information Sheet and instruction to access the V-Safe system.   Amy [name] was instructed to call 911 with any severe reactions post vaccine: Marland Kitchen Difficulty breathing  . Swelling of face and throat  . A fast heartbeat  . A bad rash all over body  . Dizziness and weakness   Immunizations Administered    Name Date Dose VIS Date Route   Pfizer COVID-19 Vaccine 07/24/2019  3:52 PM 0.3 mL 04/30/2019 Intramuscular   Manufacturer: [employer], [employer]   Lot: [id number]   NDC: [address]-6

## 2019-08-14 ENCOUNTER — Ambulatory Visit: Payer: 59

## 2019-08-14 ENCOUNTER — Ambulatory Visit: Payer: 59 | Attending: Internal Medicine

## 2019-08-14 DIAGNOSIS — Z23 Encounter for immunization: Secondary | ICD-10-CM

## 2019-08-14 NOTE — Progress Notes (Signed)
   Covid-19 Vaccination Clinic  Name:  Amy [name]    MRN: [id number] DOB: 07/12/1970  08/14/2019  [name] was observed post Covid-19 immunization for 15 minutes without incident. She was provided with Vaccine Information Sheet and instruction to access the V-Safe system.   [name] was instructed to call 911 with any severe reactions post vaccine: Marland Kitchen Difficulty breathing  . Swelling of face and throat  . A fast heartbeat  . A bad rash all over body  . Dizziness and weakness   Immunizations Administered    Name Date Dose VIS Date Route   Pfizer COVID-19 Vaccine 08/14/2019  2:10 PM 0.3 mL 04/30/2019 Intramuscular   Manufacturer: [employer], [employer]   Lot: [id number]   NDC: [address]-5

## 2019-10-22 ENCOUNTER — Other Ambulatory Visit: Payer: Self-pay | Admitting: Gastroenterology

## 2019-12-13 ENCOUNTER — Other Ambulatory Visit (HOSPITAL_COMMUNITY)
Admission: RE | Admit: 2019-12-13 | Discharge: 2019-12-13 | Disposition: A | Discharge status: Home / Self Care | Payer: No Typology Code available for payment source | Source: Ambulatory Visit | Attending: Gastroenterology | Admitting: Gastroenterology

## 2019-12-13 DIAGNOSIS — Z20822 Contact with and (suspected) exposure to covid-19: Secondary | ICD-10-CM | POA: Diagnosis not present

## 2019-12-13 DIAGNOSIS — Z01812 Encounter for preprocedural laboratory examination: Secondary | ICD-10-CM | POA: Insufficient documentation

## 2019-12-13 LAB — SARS CORONAVIRUS 2 (TAT 6-24 HRS): SARS Coronavirus 2: NEGATIVE

## 2019-12-15 NOTE — H&P (Signed)
History of Present Illness  General:  49 year old female ,morbidly obese, BMI of 54, was referred for a screening colonoscopy. Labs from 05/03/2019 showed an unremarkable CMP, slightly low vitamin D levels. No prior colonoscopy. There is no family history of colon cancer. SHe has regular Bms, mostly daily,or at least once in 2 days. Occasionally with hard stools she notes small streaks of bright red blood, last was over 6 months. Denies black stools. SHe has made dietary changes, has been more active, to intentionally lose weight. Denies nausea, vomiting, acid reflux or heartburn, denies difficuty or pain on swallowing. She c/o increased gas(all her life), denies bloating, denies early satiety.   Current Medications  Taking   Hydrochlorothiazide 12.5 MG Capsule 1 capsule Orally once a day as needed   Vitamin D3 50 MCG (2000 UT) Capsule 2.5 capsules Orally Once a day   MVI OTC MVI Tablets 1 tablet by mouth daily   Advil(Ibuprofen) 200 MG Tablet 1 tablet Orally as needed   Glucosamine Chondr 500 Complex - Capsule 1 tab Orally daily   Not-Taking   Pravastatin Sodium 10 MG Tablet 1 tablet Orally Once a day   Medication List reviewed and reconciled with the patient    Past Medical History  Hypertension.   Morbid obesity.   hyperlipidemia (normal TSH 2009).   prediabetes (review of patient's record notes DM2).   vitamin D deficiency.   History of iron deficiency anemia.   Lower extremity edema.   Fibroids.   + microalbumin 07/2010.   Thyroid nodule.   COVID-19 - 12/2018.    Surgical History  c -section 1993  BTL 2008  TAH 2016  partial hyst 2016   Family History  Father: alive 74 yrs, acid reflex,dementia, diagnosed with COPD (chronic obstructive pulmonary disease)  Mother: alive 62 yrs, arthritis, pacemaker, Diabetes, Hypertension  Brother 1: alive 18 yrs, A&W  Brother2: deceased 22 yrs, MVA  Sister 1: alive 76 yrs, borderline DM, Diabetes, Hypertension  Sister  2: alive 11 yrs, Hypertension  biological son - healthy 1 son adopted - healthy, No Family History of Colon Cancer, Polyps, or Liver Disease.   Social History  General:  Tobacco use  cigarettes: Never smoked Tobacco history last updated 10/07/2019 Vaping No no EXPOSURE TO PASSIVE SMOKE.  Alcohol: Rare, wine.  Caffeine: soda- sprite zero 12oz 3-4 a week,decaf coffee or tea rare.  Recreational drug use: never.  Exercise: walking, 3 times weekly.  Marital Status: single.  Children: 2, boys; one was adopted, 1 grand daughter.  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  OCCUPATION: Merchandiser, retail at [employer].    Allergies  Lisinopril: cough - Side Effects   Hospitalization/Major Diagnostic Procedure  childbirth 1993  surgery   Not within last yr, no recent ER visits 09/2019   Review of Systems  GI PROCEDURE:  no Pacemaker/ AICD, no. no Artificial heart valves. no MI/heart attack. no Abnormal heart rhythm. no Angina. no CVA. Hypertension YES. no Hypotension. no Asthma, COPD. no Sleep apnea. no Seizure disorders. no Artificial joints. no Severe DJD. no Diabetes. no Significant headaches. no Vertigo. no Depression/anxiety. no Abnormal bleeding. no Kidney Disease. no Liver disease, no. no Chance of pregnancy. no Blood transfusion.       Vital Signs  Wt 345.2, Wt change -7.4 lb, Ht 68, BMI 52.48, Temp 96.8, Pulse sitting 86, BP sitting 144/98.   Examination  Gastroenterology:: GENERAL APPEARANCE: Well developed, obese, pleasant, no acute distress .  SCLERA: anicteric.  CARDIOVASCULAR PMI LS border. Normal RRR w/o murmers or gallops. No peripheral edema.  RESPIRATORY Breath sounds normal. Respiration even and unlabored.  ABDOMEN No masses palpated. Liver and spleen not palpated, normal. Bowel sounds normal, Abdomen not distended.  EXTREMITIES: No edema.  NEURO: alert,oriented to time,place and person, normal  gait.  PSYCH: mood/affect normal.     Assessments     1. Screen for colon cancer - Z12.11 (Primary)   2. Morbidly obese - E66.01   Treatment  1. Screen for colon cancer  IMAGING: Colonoscopy Notes: The risks and the benefits of the procedure were dicussed with the patient in details.  She understands and verbalizes consent.  SHe will be given written instructions, prescription for preparation and will be scheduled for the same.  .    2. Morbidly obese  Notes: As her BMI is above 50, will perform the procedure as an outpatient at [facility].

## 2019-12-16 ENCOUNTER — Ambulatory Visit (HOSPITAL_COMMUNITY): Payer: No Typology Code available for payment source | Admitting: Anesthesiology

## 2019-12-16 ENCOUNTER — Encounter (HOSPITAL_COMMUNITY)
Admission: RE | Disposition: A | Discharge status: Home / Self Care | Payer: Self-pay | Source: Home / Self Care | Attending: Gastroenterology

## 2019-12-16 ENCOUNTER — Encounter (HOSPITAL_COMMUNITY): Payer: Self-pay | Admitting: Gastroenterology

## 2019-12-16 ENCOUNTER — Ambulatory Visit (HOSPITAL_COMMUNITY)
Admission: RE | Admit: 2019-12-16 | Discharge: 2019-12-16 | Disposition: A | Discharge status: Home / Self Care | Payer: No Typology Code available for payment source | Attending: Gastroenterology | Admitting: Gastroenterology

## 2019-12-16 ENCOUNTER — Other Ambulatory Visit: Payer: Self-pay

## 2019-12-16 DIAGNOSIS — Z8249 Family history of ischemic heart disease and other diseases of the circulatory system: Secondary | ICD-10-CM | POA: Diagnosis not present

## 2019-12-16 DIAGNOSIS — E559 Vitamin D deficiency, unspecified: Secondary | ICD-10-CM | POA: Diagnosis not present

## 2019-12-16 DIAGNOSIS — Z79899 Other long term (current) drug therapy: Secondary | ICD-10-CM | POA: Insufficient documentation

## 2019-12-16 DIAGNOSIS — Z8616 Personal history of COVID-19: Secondary | ICD-10-CM | POA: Insufficient documentation

## 2019-12-16 DIAGNOSIS — Z1211 Encounter for screening for malignant neoplasm of colon: Secondary | ICD-10-CM | POA: Diagnosis present

## 2019-12-16 DIAGNOSIS — Z833 Family history of diabetes mellitus: Secondary | ICD-10-CM | POA: Diagnosis not present

## 2019-12-16 DIAGNOSIS — E119 Type 2 diabetes mellitus without complications: Secondary | ICD-10-CM | POA: Diagnosis not present

## 2019-12-16 DIAGNOSIS — I10 Essential (primary) hypertension: Secondary | ICD-10-CM | POA: Insufficient documentation

## 2019-12-16 DIAGNOSIS — K5730 Diverticulosis of large intestine without perforation or abscess without bleeding: Secondary | ICD-10-CM | POA: Diagnosis not present

## 2019-12-16 DIAGNOSIS — E785 Hyperlipidemia, unspecified: Secondary | ICD-10-CM | POA: Diagnosis not present

## 2019-12-16 DIAGNOSIS — Z888 Allergy status to other drugs, medicaments and biological substances status: Secondary | ICD-10-CM | POA: Diagnosis not present

## 2019-12-16 DIAGNOSIS — Z6841 Body Mass Index (BMI) 40.0 and over, adult: Secondary | ICD-10-CM | POA: Diagnosis not present

## 2019-12-16 HISTORY — PX: COLONOSCOPY WITH PROPOFOL: SHX5780

## 2019-12-16 LAB — GLUCOSE, CAPILLARY: Glucose-Capillary: 95 mg/dL (ref 70–99)

## 2019-12-16 SURGERY — COLONOSCOPY WITH PROPOFOL
Anesthesia: Monitor Anesthesia Care

## 2019-12-16 MED ORDER — PROPOFOL 500 MG/50ML IV EMUL
INTRAVENOUS | Status: AC
  Filled 2019-12-16: qty 50

## 2019-12-16 MED ORDER — LIDOCAINE 2% (20 MG/ML) 5 ML SYRINGE
INTRAMUSCULAR | Status: DC | PRN
Start: 2019-12-16 — End: 2019-12-16
  Administered 2019-12-16: 100 mg via INTRAVENOUS

## 2019-12-16 MED ORDER — LACTATED RINGERS IV SOLN: INTRAVENOUS | Status: DC

## 2019-12-16 MED ORDER — PROPOFOL 10 MG/ML IV BOLUS
INTRAVENOUS | Status: DC | PRN
  Administered 2019-12-16: 50 mg via INTRAVENOUS

## 2019-12-16 MED ORDER — PROPOFOL 500 MG/50ML IV EMUL
INTRAVENOUS | Status: DC | PRN
  Administered 2019-12-16: 150 ug/kg/min via INTRAVENOUS

## 2019-12-16 MED ORDER — SODIUM CHLORIDE 0.9 % IV SOLN: INTRAVENOUS | Status: DC

## 2019-12-16 SURGICAL SUPPLY — 22 items

## 2019-12-16 NOTE — Interval H&P Note (Signed)
History and Physical Interval Note: 49/female with morbid obesity for an average risk initial screening colonoscopy.  12/16/2019 11:51 AM  Amy [name]  has presented today for colonoscopy, with the diagnosis of Screen for colon cancer.  The various methods of treatment have been discussed with the patient and family. After consideration of risks, benefits and other options for treatment, the patient has consented to  Procedure(s): COLONOSCOPY WITH PROPOFOL (N/A) as a surgical intervention.  The patient's history has been reviewed, patient examined, no change in status, stable for surgery.  I have reviewed the patient's chart and labs.  Questions were answered to the patient's satisfaction.     Kerin Salen

## 2019-12-16 NOTE — Discharge Instructions (Signed)

## 2019-12-16 NOTE — Anesthesia Preprocedure Evaluation (Addendum)
Anesthesia Evaluation  Patient identified by MRN, date of birth, ID band Patient awake    Reviewed: Allergy & Precautions, NPO status , Patient's Chart, lab work & pertinent test results, reviewed documented beta blocker date and time   History of Anesthesia Complications (+) PONV and history of anesthetic complications  Airway Mallampati: II  TM Distance: >3 FB Neck ROM: Full    Dental  (+) Teeth Intact, Caps, Dental Advisory Given,    Pulmonary neg pulmonary ROS,    Pulmonary exam normal breath sounds clear to auscultation       Cardiovascular hypertension, Pt. on medications Normal cardiovascular exam Rhythm:Regular Rate:Normal     Neuro/Psych negative neurological ROS  negative psych ROS   GI/Hepatic Neg liver ROS, For Screening colonoscopy   Endo/Other  diabetes, Well Controlled, Type 2Morbid obesityHyperlipidemia  Renal/GU   negative genitourinary   Musculoskeletal negative musculoskeletal ROS (+)   Abdominal (+) + obese,   Peds  Hematology  (+) anemia ,   Anesthesia Other Findings   Reproductive/Obstetrics                            Anesthesia Physical Anesthesia Plan  ASA: III  Anesthesia Plan: MAC   Post-op Pain Management:    Induction: Intravenous  PONV Risk Score and Plan: 3 and Propofol infusion  Airway Management Planned: Natural Airway and Simple Face Mask  Additional Equipment:   Intra-op Plan:   Post-operative Plan:   Informed Consent: I have reviewed the patients History and Physical, chart, labs and discussed the procedure including the risks, benefits and alternatives for the proposed anesthesia with the patient or authorized representative who has indicated his/her understanding and acceptance.     Dental advisory given  Plan Discussed with: CRNA and Anesthesiologist  Anesthesia Plan Comments:         Anesthesia Quick Evaluation

## 2019-12-16 NOTE — Op Note (Signed)
[facility] Patient Name: Amy [name] Procedure Date: 12/16/2019 MRN: [id number] Attending MD: Kerin Salen , MD Date of Birth: [birth year] CSN: [id number] Age: 49 Admit Type: Outpatient Procedure:                Colonoscopy Indications:              Screening for colorectal malignant neoplasm, This                            is the patient's first colonoscopy Providers:                Kerin Salen, MD, Vicki Mallet, RN, Melany Guernsey,                            Technician, Wanita Chamberlain, Technician Referring MD:             Mechele Claude Medicines:                Monitored Anesthesia Care Complications:            No immediate complications. Estimated blood loss:                            None. Estimated Blood Loss:     Estimated blood loss: none. Procedure:                Pre-Anesthesia Assessment:                           - Prior to the procedure, a History and Physical                            was performed, and patient medications and                            allergies were reviewed. The patient's tolerance of                            previous anesthesia was also reviewed. The risks                            and benefits of the procedure and the sedation                            options and risks were discussed with the patient.                            All questions were answered, and informed consent                            was obtained. Prior Anticoagulants: The patient has                            taken no previous anticoagulant or antiplatelet                            agents. ASA  Grade Assessment: III - A patient with                            severe systemic disease. After reviewing the risks                            and benefits, the patient was deemed in                            satisfactory condition to undergo the procedure.                           After obtaining informed consent, the colonoscope                            was  passed under direct vision. Throughout the                            procedure, the patient's blood pressure, pulse, and                            oxygen saturations were monitored continuously. The                            [id number]-[id number] ([id number]) Olympus pediatric colonscope                            was introduced through the anus and advanced to the                            the terminal ileum. The colonoscopy was performed                            without difficulty. The patient tolerated the                            procedure well. The quality of the bowel                            preparation was good. Scope In: 11:59:57 AM Scope Out: 12:14:19 PM Scope Withdrawal Time: 0 hours 9 minutes 20 seconds  Total Procedure Duration: 0 hours 14 minutes 22 seconds  Findings:      The perianal and digital rectal examinations were normal.      A few small-mouthed diverticula were found in the sigmoid colon.      The terminal ileum appeared normal.      The exam was otherwise without abnormality on direct and retroflexion       views. Impression:               - Diverticulosis in the sigmoid colon.                           - The examined portion of the ileum was normal.                           -  The examination was otherwise normal on direct                            and retroflexion views.                           - No specimens collected. Moderate Sedation:      Patient did not receive moderate sedation for this procedure, but       instead received monitored anesthesia care. Recommendation:           - Patient has a contact number available for                            emergencies. The signs and symptoms of potential                            delayed complications were discussed with the                            patient. Return to normal activities tomorrow.                            Written discharge instructions were provided to the                             patient.                           - High fiber diet.                           - Continue present medications.                           - Repeat colonoscopy in 10 years for screening                            purposes. Procedure Code(s):        --- Professional ---                           [id number], Colorectal cancer screening; colonoscopy on                            individual not meeting criteria for high risk Diagnosis Code(s):        --- Professional ---                           Z12.11, Encounter for screening for malignant                            neoplasm of colon                           K57.30, Diverticulosis of large intestine without  perforation or abscess without bleeding CPT copyright 2019 American Medical Association. All rights reserved. The codes documented in this report are preliminary and upon coder review may  be revised to meet current compliance requirements. Kerin Salen, MD 12/16/2019 12:19:50 PM This report has been signed electronically. Number of Addenda: 0

## 2019-12-16 NOTE — Transfer of Care (Signed)
Immediate Anesthesia Transfer of Care Note  Patient: Amy [name]  Procedure(s) Performed: COLONOSCOPY WITH PROPOFOL (N/A )  Patient Location: PACU  Anesthesia Type:MAC  Level of Consciousness: awake, alert  and oriented  Airway & Oxygen Therapy: Patient Spontanous Breathing  Post-op Assessment: Report given to RN and Post -op Vital signs reviewed and stable  Post vital signs: Reviewed and stable  Last Vitals:  Vitals Value Taken Time  BP    Temp    Pulse    Resp    SpO2      Last Pain:  Vitals:   12/16/19 1050  TempSrc: Oral  PainSc: 0-No pain         Complications: No complications documented.

## 2019-12-16 NOTE — Brief Op Note (Signed)
12/16/2019  12:19 PM  PATIENT:  Amy [name]  49 y.o. female  PRE-OPERATIVE DIAGNOSIS:  Screen for colon cancer  POST-OPERATIVE DIAGNOSIS:  mild sigmoid diverticulosis  PROCEDURE:  Procedure(s): COLONOSCOPY WITH PROPOFOL (N/A)  SURGEON:  Surgeon(s) and Role:    Kerin Salen, MD - Primary  PHYSICIAN ASSISTANT: Otho Najjar, Tech  ASSISTANTS: none   ANESTHESIA:   none  EBL:  0 mL   BLOOD ADMINISTERED:none  DRAINS: none   LOCAL MEDICATIONS USED:  NONE  SPECIMEN:  No Specimen  DISPOSITION OF SPECIMEN:  N/A  COUNTS:  YES  TOURNIQUET:  * No tourniquets in log *  DICTATION: .Dragon Dictation  PLAN OF CARE: Discharge to home after PACU  PATIENT DISPOSITION:  PACU - hemodynamically stable.   Delay start of Pharmacological VTE agent (>24hrs) due to surgical blood loss or risk of bleeding: not applicable

## 2019-12-16 NOTE — Anesthesia Procedure Notes (Signed)
Date/Time: 12/16/2019 11:58 AM Performed by: Jhonnie Garner, CRNA Oxygen Delivery Method: Simple face mask

## 2019-12-16 NOTE — Anesthesia Postprocedure Evaluation (Signed)
Anesthesia Post Note  Patient: Amy [name]  Procedure(s) Performed: COLONOSCOPY WITH PROPOFOL (N/A )     Patient location during evaluation: PACU Anesthesia Type: MAC Level of consciousness: awake and alert and oriented Pain management: pain level controlled Vital Signs Assessment: post-procedure vital signs reviewed and stable Respiratory status: spontaneous breathing, nonlabored ventilation and respiratory function stable Cardiovascular status: stable and blood pressure returned to baseline Postop Assessment: no apparent nausea or vomiting Anesthetic complications: no   No complications documented.  Last Vitals:  Vitals:   12/16/19 1050 12/16/19 1220  BP: (!) 153/97 (!) 105/55  Pulse: 82   Resp: 15 16  Temp: 36.7 C   SpO2: 98% 100%    Last Pain:  Vitals:   12/16/19 1220  TempSrc:   PainSc: 0-No pain                 Haasini Patnaude A.

## 2019-12-17 ENCOUNTER — Encounter (HOSPITAL_COMMUNITY): Payer: Self-pay | Admitting: Gastroenterology

## 2021-10-24 ENCOUNTER — Other Ambulatory Visit: Payer: Self-pay | Admitting: Obstetrics and Gynecology

## 2021-10-24 DIAGNOSIS — E041 Nontoxic single thyroid nodule: Secondary | ICD-10-CM

## 2021-10-26 ENCOUNTER — Other Ambulatory Visit: Payer: No Typology Code available for payment source

## 2021-11-16 ENCOUNTER — Ambulatory Visit
Admission: RE | Admit: 2021-11-16 | Discharge: 2021-11-16 | Disposition: A | Discharge status: Home / Self Care | Payer: No Typology Code available for payment source | Source: Ambulatory Visit | Attending: Obstetrics and Gynecology | Admitting: Obstetrics and Gynecology

## 2021-11-16 DIAGNOSIS — E041 Nontoxic single thyroid nodule: Secondary | ICD-10-CM

## 2021-11-28 ENCOUNTER — Ambulatory Visit (INDEPENDENT_AMBULATORY_CARE_PROVIDER_SITE_OTHER): Payer: No Typology Code available for payment source

## 2021-11-28 ENCOUNTER — Encounter (HOSPITAL_COMMUNITY): Payer: Self-pay

## 2021-11-28 ENCOUNTER — Ambulatory Visit (HOSPITAL_COMMUNITY)
Admission: EM | Admit: 2021-11-28 | Discharge: 2021-11-28 | Disposition: A | Discharge status: Home / Self Care | Payer: No Typology Code available for payment source | Attending: Urgent Care | Admitting: Urgent Care

## 2021-11-28 DIAGNOSIS — M25561 Pain in right knee: Secondary | ICD-10-CM | POA: Diagnosis present

## 2021-11-28 LAB — D-DIMER, QUANTITATIVE: D-Dimer, Quant: 0.35 ug/mL-FEU (ref 0.00–0.50)

## 2021-11-28 MED ORDER — ETODOLAC 500 MG PO TABS: 500.0000 mg | ORAL_TABLET | Freq: Two times a day (BID) | ORAL | 0 refills | Status: AC

## 2021-11-28 MED ORDER — ETODOLAC 500 MG PO TABS: 500.0000 mg | ORAL_TABLET | Freq: Two times a day (BID) | ORAL | 0 refills | Status: DC

## 2021-11-28 NOTE — Discharge Instructions (Signed)
Your x-ray shows mild osteoarthritis.  I do not believe this is the source of your pain. You may have a MCL strain, I have prescribed an anti-inflammatory to take. We will call you with the results of your lab test in the next 1 to 2 hours.  If it is positive, we will refer you to the emergency room for an ultrasound of your extremity.  If it is negative, please take the anti-inflammatory twice daily with with food.

## 2021-11-28 NOTE — ED Provider Notes (Signed)
[facility]    CSN: [id number] Arrival date & time: 11/28/21  1645      History   Chief Complaint Chief Complaint  Patient presents with   Knee Pain    HPI Amy [name] is a 51 y.o. female.   Pleasant 51 year old female presents today with concerns of right knee pain.  She states it started last week on Thursday after sitting at a [employer] baseball game for several hours.  She was in an aisle seat and states that her left leg was able to stretch out intermittently, but her right knee was bent for an extended period of time.  She denies any injury.  She states the pain is primarily to the medial/posterior aspect of her knee.  She reports it is a dull ache.  Walking does seem to make it worse.  She has not fallen, there is no swelling.  She tried over-the-counter ibuprofen without significant relief to her discomfort.  She denies any chronic knee issues other than "normal aches from my weight".  She does have a known history of bilateral edema, and states that it is usually worse on her right side.  She does not feel that her edema at this point is any worse than normal.  She does also note a new onset of right ankle pain, and feels it is due to her walking differently from the pain.  She does not smoke.  She denies a history of blood clots.  She takes as needed HCTZ.  She is not on any blood thinners.   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    Class: Status post   S/P TAH (total abdominal hysterectomy) 03/27/2015    Past Surgical History:  Procedure Laterality Date   ABDOMINAL HYSTERECTOMY Bilateral 03/27/2015   Procedure: HYSTERECTOMY ABDOMINAL WITH BILATERAL SALPINGECTOMY;  Surgeon: Richardean Chimera, MD;  Location: [facility];  Service: Gynecology;  Laterality: Bilateral;   CESAREAN SECTION     COLONOSCOPY WITH PROPOFOL N/A 12/16/2019   Procedure: COLONOSCOPY WITH PROPOFOL;  Surgeon: Kerin Salen, MD;  Location: [facility];  Service: Gastroenterology;  Laterality: N/A;   LAPAROSCOPIC VAGINAL HYSTERECTOMY WITH SALPINGECTOMY Bilateral 03/27/2015   Procedure: ATTEMPTED LAPAROSCOPIC ASSISTED VAGINAL HYSTERECTOMY WITH BILATERAL SALPINGECTOMY;  Surgeon: Richardean Chimera, MD;  Location: [facility];  Service: Gynecology;  Laterality: Bilateral;   TUBAL LIGATION     WISDOM TOOTH EXTRACTION      OB History   No obstetric history on file.      Home Medications    Prior to Admission medications   Medication Sig Start Date End Date Taking? Authorizing Provider  acetaminophen (TYLENOL) 500 MG tablet Take 1,000 mg by mouth every 6 (six) hours as needed for moderate pain or headache.    [provider]  Ascorbic Acid (VITAMIN C) 1000 MG tablet Take 1,000 mg by mouth daily.    [provider]  Cholecalciferol (DIALYVITE VITAMIN D 5000)  125 MCG (5000 UT) capsule Take 5,000 Units by mouth daily.    [provider]  etodolac (LODINE) 500 MG tablet Take 1 tablet (500 mg total) by mouth 2 (two) times daily with a meal for 7 days. 11/28/21 12/05/21  Seena Face L, PA  fexofenadine-pseudoephedrine (ALLEGRA-D 24) 180-240 MG 24 hr tablet Take 1 tablet by mouth daily as needed (allergies).    [provider]  hydrochlorothiazide (MICROZIDE) 12.5 MG capsule Take 1 capsule (12.5 mg total) by mouth daily as needed (FOR SWELLING OR ELEVATED BLOOD PRESSURE). Patient taking differently: Take 12.5 mg by mouth  daily as needed (swelling).  10/19/15   Chilton Si, MD  ibuprofen (ADVIL,MOTRIN) 200 MG tablet Take 400-800 mg by mouth as needed for headache or moderate pain.     [provider]  Multiple Vitamin (MULTIVITAMIN WITH MINERALS) TABS tablet Take 1 tablet by mouth daily after breakfast.    [provider]  pravastatin (PRAVACHOL) 10 MG tablet Take 20 mg by mouth every evening.    [provider]    Family History Family History  Problem Relation Age of Onset   Hypertension Mother    Diabetes Mother    Dementia Father    Diabetes Sister    Hypertension Sister    Hypertension Maternal Grandmother    Arthritis Maternal Grandmother    Diabetes Paternal Grandmother    Stroke Paternal Grandmother    Hypertension Sister     Social History Social History   Tobacco Use   Smoking status: Never   Smokeless tobacco: Never  Substance Use Topics   Alcohol use: Yes    Comment: occasionally (couples times a year)   Drug use: No     Allergies   Lisinopril   Review of Systems Review of Systems Knee pain  Physical Exam Triage Vital Signs ED Triage Vitals  Enc Vitals Group     BP 11/28/21 1716 (!) 154/92     Pulse Rate 11/28/21 1716 90     Resp 11/28/21 1716 16     Temp 11/28/21 1716 98.4 F (36.9 C)     Temp Source 11/28/21 1716 Oral     SpO2 11/28/21 1716 94 %     Weight --      Height --      Head Circumference --      Peak Flow --      Pain Score 11/28/21 1713 1     Pain Loc --      Pain Edu? --      Excl. in GC? --    No data found.  Updated Vital Signs BP (!) 154/92 (BP Location: Left Arm)   Pulse 90   Temp 98.4 F (36.9 C) (Oral)   Resp 16   LMP 03/06/2015 (Exact Date)   SpO2 94%   Visual Acuity Right Eye Distance:   Left Eye Distance:   Bilateral Distance:    Right Eye Near:   Left Eye Near:    Bilateral Near:     Physical Exam Vitals reviewed.  Musculoskeletal:     Right upper leg: Normal. No swelling, edema,  deformity, lacerations, tenderness or bony tenderness.     Left upper leg: Normal. No swelling, edema, deformity, lacerations, tenderness or bony tenderness.     Right knee: No swelling, deformity, effusion, erythema, ecchymosis, lacerations, bony tenderness or crepitus. Normal range of motion. No tenderness (to direct palpation of MCL). MCL laxity present. No LCL laxity, ACL laxity or PCL laxity.  Normal alignment, normal meniscus and normal patellar mobility. Normal pulse.     Instability Tests: Anterior drawer test negative. Posterior drawer test negative. Anterior Lachman test negative.     Left knee: Normal. No swelling, deformity, effusion, erythema, ecchymosis, lacerations, bony tenderness or crepitus. Normal range of motion. No tenderness. No LCL laxity, MCL laxity, ACL laxity or PCL laxity.Normal alignment, normal meniscus and normal patellar mobility. Normal pulse.     Instability Tests: Anterior drawer test negative. Posterior drawer test negative. Anterior Lachman test negative.     Right lower leg: No swelling, deformity, tenderness or bony tenderness. 3+ Edema present.     Left lower leg: No swelling, deformity, tenderness or bony tenderness. 1+ Edema present.     Right ankle: Normal. No deformity, ecchymosis or lacerations. No tenderness. Normal range of motion. Normal pulse.     Right Achilles Tendon: Normal. No tenderness or defects. Thompson's test negative.     Left ankle: Normal. No deformity, ecchymosis or lacerations. No tenderness. Normal range of motion. Normal pulse.     Left Achilles Tendon: Normal. No tenderness or defects. Thompson's test negative.     Right foot: Swelling present.     Left foot: Swelling present.      UC Treatments / Results  Labs (all labs ordered are listed, but only abnormal results are displayed) Labs Reviewed  D-DIMER, QUANTITATIVE    EKG   Radiology DG Knee Complete 4 Views Right  Result Date: 11/28/2021 CLINICAL DATA:  Right knee pain  EXAM: RIGHT KNEE - COMPLETE 4+ VIEW COMPARISON:  None Available. FINDINGS: Alignment is anatomic. No acute fracture. Medial and patellofemoral compartment joint space narrowing and spurring. Joint effusion. IMPRESSION: Medial and patellofemoral compartment osteoarthritis. Joint effusion. Electronically Signed   By: Guadlupe Spanish M.D.   On: 11/28/2021 18:16    Procedures Procedures (including critical care time)  Medications Ordered in UC Medications - No data to display  Initial Impression / Assessment and Plan / UC Course  I have reviewed the triage vital signs and the nursing notes.  Pertinent labs & imaging results that were available during my care of the patient were reviewed by me and considered in my medical decision making (see chart for details).     Acute knee pain - no trauma. Pt requesting xray, which showed no cause of the pain. Due to swelling worse on R, despite it being at patients baseline, d-dimer ordered to r/o DVT. Results received after printed AVS, but prior to DC, therefore pt notified by provider of normal results. Suspect clinically MCL strain. Will start NSAID BID, ROM exercises. F/U with PCP or ortho if symptoms persist. ER precautions reviewed   Final Clinical Impressions(s) / UC Diagnoses   Final diagnoses:  Acute pain of right knee     Discharge Instructions      Your x-ray shows mild osteoarthritis.  I do not believe this is the source of your pain. You may have a MCL strain, I have prescribed an anti-inflammatory to take. We will call you with the results of your lab test in the next 1 to 2 hours.  If it is positive, we will refer you to the emergency room for an ultrasound of your extremity.  If it is negative, please take the anti-inflammatory twice daily with with food.     ED Prescriptions     Medication Sig Dispense Auth. Provider   etodolac (LODINE) 500 MG tablet  (Status: Discontinued) Take 1 tablet (500 mg total) by  mouth 2 (two) times  daily with a meal for 7 days. 14 tablet Monterius Rolf L, PA   etodolac (LODINE) 500 MG tablet Take 1 tablet (500 mg total) by mouth 2 (two) times daily with a meal for 7 days. 14 tablet Datha Kissinger L, [place]      PDMP not reviewed this encounter.   Maretta Bees, [place] 11/30/21 [phone]

## 2021-11-28 NOTE — ED Triage Notes (Signed)
Pt presents with c/o rt knee pain that began last Thursday. NKI.

## 2022-02-11 ENCOUNTER — Ambulatory Visit (INDEPENDENT_AMBULATORY_CARE_PROVIDER_SITE_OTHER): Payer: No Typology Code available for payment source | Admitting: Orthopedic Surgery

## 2022-02-11 ENCOUNTER — Encounter: Payer: Self-pay | Admitting: Orthopedic Surgery

## 2022-02-11 VITALS — BP 184/124 | HR 102 | Ht 68.5 in | Wt 340.0 lb

## 2022-02-11 DIAGNOSIS — E785 Hyperlipidemia, unspecified: Secondary | ICD-10-CM | POA: Insufficient documentation

## 2022-02-11 DIAGNOSIS — M23321 Other meniscus derangements, posterior horn of medial meniscus, right knee: Secondary | ICD-10-CM | POA: Diagnosis not present

## 2022-02-11 DIAGNOSIS — R7303 Prediabetes: Secondary | ICD-10-CM | POA: Insufficient documentation

## 2022-02-11 DIAGNOSIS — M25561 Pain in right knee: Secondary | ICD-10-CM

## 2022-02-11 NOTE — Progress Notes (Signed)
New patient evaluation  Chief complaint Chief Complaint  Patient presents with   Knee Pain    Right     History of present illness this is a 51 year old female who went to the [employer] game with her son.  After sitting in the chair for a long period of time she stood up for the seventh ending stretch and felt medial knee pain.  She limped out of the stadium and had pain for 1 to 2 weeks and then went to urgent care she was diagnosed with an MCL sprain and was put on ibuprofen which she has been taking  She still having pain limping and trouble bending and straightening the knee with an occasional feeling of giving way  Date of the game was July 6  Review of Systems  Constitutional:  Negative for chills and fever.  Respiratory: Negative.    Cardiovascular: Negative.   Skin:  Negative for rash.  Neurological:  Negative for tingling.   Past Medical History:  Diagnosis Date   Anemia    Diabetes mellitus without complication ([place])    PRE-DIABETIC, NO LONGER NEED MEDICATION   Essential hypertension 09/28/2015   Hypertension    Leg swelling    PONV (postoperative nausea and vomiting)    Pre-syncope 09/28/2015   PVC (premature ventricular contraction) 09/28/2015   Vitamin D deficiency    Constitutional: Vital signs BP (!) 184/124   Pulse (!) 102   Ht 5' 8.5" (1.74 m)   Wt (!) 340 lb (154.2 kg)   LMP 03/06/2015 (Exact Date)   BMI 50.94 kg/m    General appearance   normal, The patient meets the AMA guidelines for Morbid (severe) obesity with a BMI > 40.0 and I have recommended weight loss.   Cardiovascular:  swelling none varicosities  nrml  palpation of pulses    nrml  temperature no  edema  no  tenderness  none  Lymph nodes  not checked   Skin normal both knees   Neuro : coordination, normal  deep tendon reflexes normal   sensation normal   Psych: alert and oriented x 3     depression no anxiety no agitation no  Musculoskeletal gait the patient does exhibit  limping of the right knee I did not  I did not detect an effusion, she does have tenderness on the medial joint line positive McMurray's ligaments are stable muscle tone is normal   Encounter Diagnoses  Name Primary?   Acute pain of right knee Yes   Other meniscus derangements, posterior horn of medial meniscus, right knee    51 year old female pain since July 6 appears to have medial meniscus tear recommend MRI continue Tylenol ibuprofen follow-up after MRI surgery may be needed

## 2022-02-11 NOTE — Patient Instructions (Signed)
While we are working on your approval for MRI please go ahead and call to schedule your appointment with [facility] within at least one (1) week.   [facility] [phone]  Your insurance will not approve study at [facility], have to send you to [place]. If the insurance says you have to have physical therapy first before the MRI scan I will let you know.

## 2022-02-17 ENCOUNTER — Ambulatory Visit
Admission: RE | Admit: 2022-02-17 | Discharge: 2022-02-17 | Disposition: A | Discharge status: Home / Self Care | Payer: No Typology Code available for payment source | Source: Ambulatory Visit | Attending: Orthopedic Surgery | Admitting: Orthopedic Surgery

## 2022-02-17 DIAGNOSIS — M25561 Pain in right knee: Secondary | ICD-10-CM

## 2022-02-21 ENCOUNTER — Ambulatory Visit (INDEPENDENT_AMBULATORY_CARE_PROVIDER_SITE_OTHER): Payer: No Typology Code available for payment source | Admitting: Orthopedic Surgery

## 2022-02-21 ENCOUNTER — Encounter: Payer: Self-pay | Admitting: Orthopedic Surgery

## 2022-02-21 DIAGNOSIS — M1711 Unilateral primary osteoarthritis, right knee: Secondary | ICD-10-CM | POA: Diagnosis not present

## 2022-02-21 DIAGNOSIS — M23321 Other meniscus derangements, posterior horn of medial meniscus, right knee: Secondary | ICD-10-CM | POA: Diagnosis not present

## 2022-02-21 DIAGNOSIS — M1710 Unilateral primary osteoarthritis, unspecified knee: Secondary | ICD-10-CM

## 2022-02-21 NOTE — Progress Notes (Addendum)
Chief Complaint  Patient presents with   Results    Review MRI right knee    Amy [name] has had her MRI she still having symptomatic right knee pain which is causing her to limp.  Her pain is not improved her range of motion is still less than normal and the knee feels like it will give way  I went over the MRI findings with her  We discussed the arthritis and torn meniscus and how that would affect long-term health of her knee  3 to 5-year.  May require knee replacement although she will get improvement after meniscectomy  We are recommending that she have arthroscopic surgery of the right knee and a partial medial meniscectomy with a 2 to 3-day time out of work as she works from home  Encounter Diagnoses  Name Primary?   Other meniscus derangements, posterior horn of medial meniscus, right knee Yes   Primary localized osteoarthritis of knee    Plan is for arthroscopic surgery right knee partial medial meniscectomy  The procedure has been fully reviewed with the patient; The risks and benefits of surgery have been discussed and explained and understood. Alternative treatment has also been reviewed, questions were encouraged and answered. The postoperative plan is also been reviewed.

## 2022-02-21 NOTE — Patient Instructions (Addendum)
Your surgery will be at [place] by Dr Harrison  The hospital will contact you with a preoperative appointment to discuss Anesthesia.  Please arrive on time or 15 minutes early for the preoperative appointment, they have a very tight schedule if you are late or do not come in your surgery will be cancelled.  The phone number is [phone]. Please bring your medications with you for the appointment. They will tell you the arrival time and medication instructions when you have your preoperative evaluation. Do not wear nail polish the day of your surgery and if you take Phentermine you need to stop this medication ONE WEEK prior to your surgery. If you take Invokana, Farxiga, Jardiance, or Steglatro) - Hold 72 hours before the procedure.  If you take Ozempic,  Bydureon or Trulicity do not take for 8 days before your surgery. If you take Victoza, Rybelsis, Saxenda or Adlyxi stop 24 hours before the procedure.  Please arrive at the hospital 2 hours before procedure if scheduled at 9:30 or later in the day or at the time the nurse tells you at your preoperative visit.   If you have my chart do not use the time given in my chart use the time given to you by the nurse during your preoperative visit.   Your surgery  time may change. Please be available for phone calls the day of your surgery and the day before. The Short Stay department may need to discuss changes about your surgery time. Not reaching the you could lead to procedure delays and possible cancellation.  You must have a ride home and someone to stay with you for 24 to 48 hours. The person taking you home will receive and sign for the your discharge instructions.  Please be prepared to give your support person's name and telephone number to [facility]. Dr Harrison will need that name and phone number post procedure.    Meniscus Injury, Arthroscopy   Arthroscopy is a surgical procedure that involves the use of a small scope that  has a camera and surgical instruments on the end (arthroscope). An arthroscope can be used to repair your meniscus injury.  LET YOUR HEALTH CARE PROVIDER KNOW ABOUT: Any allergies you have. All medicines you are taking, including vitamins, herbs, eyedrops, creams, and over-the-counter medicines. Any recent colds or infections you have had or currently have. Previous problems you or members of your family have had with the use of anesthetics. Any blood disorders or blood clotting problems you have. Previous surgeries you have had. Medical conditions you have. RISKS AND COMPLICATIONS Generally, this is a safe procedure. However, as with any procedure, problems can occur. Possible problems include: Damage to nerves or blood vessels. Excess bleeding. Blood clots. Infection. BEFORE THE PROCEDURE Do not eat or drink for 6-8 hours before the procedure. Take medicines as directed by your surgeon. Ask your surgeon about changing or stopping your regular medicines. You may have lab tests the morning of surgery. PROCEDURE  You will be given one of the following:  A medicine that numbs the area (local anesthesia). A medicine that makes you go to sleep (general anesthesia). A medicine injected into your spine that numbs your body below the waist (spinal anesthesia). Most often, several small cuts (incisions) are made in the knee. The arthroscope and instruments go into the incisions to repair the damage. The torn portion of the meniscus is removed.   AFTER THE PROCEDURE You will be taken to the recovery   area where your progress will be monitored. When you are awake, stable, and taking fluids without complications, you will be allowed to go home. This is usually the same day. A torn or stretched ligament (ligament sprain) may take 6-8 weeks to heal.  It takes about the 4-6 WEEKS if your surgeon removed a torn meniscus. A repaired meniscus may require 6-12 weeks of recovery time. A torn ligament  needing reconstructive surgery may take 6-12 months to heal fully.   This information is not intended to replace advice given to you by your health care provider. Make sure you discuss any questions you have with your health care provider. You have decided to proceed with operative arthroscopy of the knee. You have decided not to continue with nonoperative measures such as but not limited to oral medication, weight loss, activity modification, physical therapy, bracing, or injection.  We will perform operative arthroscopy of the knee. Some of the risks associated with arthroscopic surgery of the knee include but are not limited to Bleeding Infection Swelling Stiffness Blood clot Pain Need for knee replacement surgery    In compliance with recent [place] law in federal regulation regarding opioid use and abuse and addiction, we will taper (stop) opioid medication after 2 weeks.  If you're not comfortable with these risks and would like to continue with nonoperative treatment please let Dr. Harrison know prior to your surgery.  

## 2022-02-25 ENCOUNTER — Other Ambulatory Visit: Payer: Self-pay | Admitting: Orthopedic Surgery

## 2022-02-25 DIAGNOSIS — M23321 Other meniscus derangements, posterior horn of medial meniscus, right knee: Secondary | ICD-10-CM

## 2022-02-27 ENCOUNTER — Telehealth: Payer: Self-pay | Admitting: Radiology

## 2022-02-27 NOTE — Telephone Encounter (Signed)
I called for surgery authorization,  and Dr Aline Brochure is out of network, if she wants to have surgery anyway she will have to call [facility] to discuss. I called patient to advise. She said she called yesterday and was told his [place] office is in network but not here, I told her he only goes here, I told her they said he is out of network  So, it does look like we are out of network with [place] [facility] [place] [facility] plan  To you FYI

## 2022-02-27 NOTE — Telephone Encounter (Signed)
Patient wants surgery by Dr Aline Brochure, and Holland Falling called back said I should submit a prior authorization request through [employer] even though we are out of network.

## 2022-03-08 NOTE — Patient Instructions (Signed)
Amy [name]  03/08/2022     @PREFPERIOPPHARMACY @   Your procedure is scheduled on  03/12/2022.   Report to 03/14/2022 at  0840  A.M.   Call this number if you have problems the morning of surgery:  [phone]  If you experience any cold or flu symptoms such as cough, fever, chills, shortness of breath, etc. between now and your scheduled surgery, please notify [phone] at the above number.   Remember:  Do not eat or drink after midnight.      Take these medicines the morning of surgery with A SIP OF WATER                                                 [place].     Do not wear jewelry, make-up or nail polish.  Do not wear lotions, powders, or perfumes, or deodorant.  Do not shave 48 hours prior to surgery.  Men may shave face and neck.  Do not bring valuables to the hospital.  [facility] is not responsible for any belongings or valuables.  Contacts, dentures or bridgework may not be worn into surgery.  Leave your suitcase in the car.  After surgery it may be brought to your room.  For patients admitted to the hospital, discharge time will be determined by your treatment team.  Patients discharged the day of surgery will not be allowed to drive home and must have someone with them for 24 hours.    Special instructions:   DO NOT smoke tobacco or vape for 24 hours before your procedure.  Please read over the following fact sheets that you were given. Coughing and Deep Breathing, Surgical Site Infection Prevention, Anesthesia Post-op Instructions, and Care and Recovery After Surgery      Arthroscopic Knee Ligament Repair, Care After This sheet gives you information about how to care for yourself after your procedure. Your health care provider may also give you more specific instructions. If you have problems or questions, contact your health care provider. What can I expect after the procedure? After the procedure, it is common to have: Soreness or pain in  your knee. Bruising and swelling on your knee, calf, and ankle for 3-4 days. A small amount of fluid coming from the incisions. Follow these instructions at home: Medicines Take over-the-counter and prescription medicines only as told by your health care provider. Ask your health care provider if the medicine prescribed to you: Requires you to avoid driving or using machinery. Can cause constipation. You may need to take these actions to prevent or treat constipation: Drink enough fluid to keep your urine pale yellow. Take over-the-counter or prescription medicines. Eat foods that are high in fiber, such as beans, whole grains, and fresh fruits and vegetables. Limit foods that are high in fat and processed sugars, such as fried or sweet foods. If you have a brace or immobilizer: Wear it as told by your health care provider. Remove it only as told by your health care provider. Loosen it if your toes tingle, become numb, or turn cold and blue. Keep it clean and dry. Ask your health care provider when it is safe to drive. Bathing Do not take baths, swim, or use a hot tub until your health care provider approves. Keep your bandage (dressing) dry  until your health care provider says that it can be removed. If the brace or immobilizer is not waterproof: Do not let it get wet. Cover it with a watertight covering when you take a bath or shower. Incision care  Follow instructions from your health care provider about how to take care of your incisions. Make sure you: Wash your hands with soap and water for at least 20 seconds before and after you change your dressing. If soap and water are not available, use hand sanitizer. Change your dressing as told by your health care provider. Leave stitches (sutures), skin glue, or adhesive strips in place. These skin closures may need to stay in place for 2 weeks or longer. If adhesive strip edges start to loosen and curl up, you may trim the loose edges.  Do not remove adhesive strips completely unless your health care provider tells you to do that. Check your incision areas every day for signs of infection. Check for: Redness. More swelling or pain. Blood or more fluid. Warmth. Pus or a bad smell. Managing pain, stiffness, and swelling  If directed, put ice on the affected area. To do this: If you have a removable brace or immobilizer, remove it as told by your health care provider. Put ice in a plastic bag. Place a towel between your skin and the bag. Leave the ice on for 20 minutes, 2-3 times a day. Remove the ice if your skin turns bright red. This is very important. If you cannot feel pain, heat, or cold, you have a greater risk of damage to the area. Move your toes often to reduce stiffness and swelling. Raise (elevate) the injured area above the level of your heart while you are sitting or lying down. Activity Do not use your knee to support your body weight until your health care provider says that you can. Use crutches or other devices as told by your health care provider. Do physical therapy exercises as told by your health care provider. Physical therapy will help you regain movement and strength in your knee. Follow instructions from your health care provider about: When you may start motion exercises. When you may start riding a stationary bike and doing other low-impact activities. When you may start to jog and do other high-impact activities. Do not lift anything that is heavier than 10 lb (4.5 kg), or the limit that you are told, until your health care provider says that it is safe. Ask your health care provider what activities are safe for you. General instructions Do not use any products that contain nicotine or tobacco, such as cigarettes, e-cigarettes, and chewing tobacco. These can delay healing. If you need help quitting, ask your health care provider. Wear compression stockings as told by your health care provider.  These stockings help to prevent blood clots and reduce swelling in your legs. Keep all follow-up visits. This is important. Contact a health care provider if: You have any of these signs of infection: Redness around an incision. Blood or more fluid coming from an incision. Warmth coming from an incision. Pus or a bad smell coming from an incision. More swelling or pain in your knee. A fever or chills. You have pain that does not get better with medicine. Your incision opens up. Get help right away if: You have trouble breathing. You have chest pain. You have increased pain or swelling in your calf or at the back of your knee. You have numbness and tingling near the knee joint  or in the foot, ankle, or toes. You notice that your foot or toes look darker than normal or are cooler than normal. These symptoms may represent a serious problem that is an emergency. Do not wait to see if the symptoms will go away. Get medical help right away. Call your local emergency services (911 in the [place] Do not drive yourself to the hospital. Summary After the procedure, it is common to have knee pain with bruising and swelling on your knee, calf, and ankle. Icing your knee and raising your leg above the level of your heart will help control the pain and swelling. Do physical therapy exercises as told by your health care provider. Physical therapy will help you regain movement and strength in your knee. This information is not intended to replace advice given to you by your health care provider. Make sure you discuss any questions you have with your health care provider. Document Revised: 10/04/2019 Document Reviewed: 10/04/2019 Elsevier Patient Education  2023 [employer]. General Anesthesia, Adult, Care After The following information offers guidance on how to care for yourself after your procedure. Your health care provider may also give you more specific instructions. If you have problems or  questions, contact your health care provider. What can I expect after the procedure? After the procedure, it is common for people to: Have pain or discomfort at the IV site. Have nausea or vomiting. Have a sore throat or hoarseness. Have trouble concentrating. Feel cold or chills. Feel weak, sleepy, or tired (fatigue). Have soreness and body aches. These can affect parts of the body that were not involved in surgery. Follow these instructions at home: For the time period you were told by your health care provider:  Rest. Do not participate in activities where you could fall or become injured. Do not drive or use machinery. Do not drink alcohol. Do not take sleeping pills or medicines that cause drowsiness. Do not make important decisions or sign legal documents. Do not take care of children on your own. General instructions Drink enough fluid to keep your urine pale yellow. If you have sleep apnea, surgery and certain medicines can increase your risk for breathing problems. Follow instructions from your health care provider about wearing your sleep device: Anytime you are sleeping, including during daytime naps. While taking prescription pain medicines, sleeping medicines, or medicines that make you drowsy. Return to your normal activities as told by your health care provider. Ask your health care provider what activities are safe for you. Take over-the-counter and prescription medicines only as told by your health care provider. Do not use any products that contain nicotine or tobacco. These products include cigarettes, chewing tobacco, and vaping devices, such as e-cigarettes. These can delay incision healing after surgery. If you need help quitting, ask your health care provider. Contact a health care provider if: You have nausea or vomiting that does not get better with medicine. You vomit every time you eat or drink. You have pain that does not get better with medicine. You  cannot urinate or have bloody urine. You develop a skin rash. You have a fever. Get help right away if: You have trouble breathing. You have chest pain. You vomit blood. These symptoms may be an emergency. Get help right away. Call 911. Do not wait to see if the symptoms will go away. Do not drive yourself to the hospital. Summary After the procedure, it is common to have a sore throat, hoarseness, nausea, vomiting, or to feel  weak, sleepy, or fatigue. For the time period you were told by your health care provider, do not drive or use machinery. Get help right away if you have difficulty breathing, have chest pain, or vomit blood. These symptoms may be an emergency. This information is not intended to replace advice given to you by your health care provider. Make sure you discuss any questions you have with your health care provider. Document Revised: 08/03/2021 Document Reviewed: 08/03/2021 Elsevier Patient Education  2023 Else[employer] Inc. How to Use Chlorhexidine Before Surgery Chlorhexidine gluconate (CHG) is a germ-killing (antiseptic) solution that is used to clean the skin. It can get rid of the bacteria that normally live on the skin and can keep them away for about 24 hours. To clean your skin with CHG, you may be given: A CHG solution to use in the shower or as part of a sponge bath. A prepackaged cloth that contains CHG. Cleaning your skin with CHG may help lower the risk for infection: While you are staying in the intensive care unit of the hospital. If you have a vascular access, such as a central line, to provide short-term or long-term access to your veins. If you have a catheter to drain urine from your bladder. If you are on a ventilator. A ventilator is a machine that helps you breathe by moving air in and out of your lungs. After surgery. What are the risks? Risks of using CHG include: A skin reaction. Hearing loss, if CHG gets in your ears and you have a perforated  eardrum. Eye injury, if CHG gets in your eyes and is not rinsed out. The CHG product catching fire. Make sure that you avoid smoking and flames after applying CHG to your skin. Do not use CHG: If you have a chlorhexidine allergy or have previously reacted to chlorhexidine. On babies younger than 38 months of age. How to use CHG solution Use CHG only as told by your health care provider, and follow the instructions on the label. Use the full amount of CHG as directed. Usually, this is one bottle. During a shower Follow these steps when using CHG solution during a shower (unless your health care provider gives you different instructions): Start the shower. Use your normal soap and shampoo to wash your face and hair. Turn off the shower or move out of the shower stream. Pour the CHG onto a clean washcloth. Do not use any type of brush or rough-edged sponge. Starting at your neck, lather your body down to your toes. Make sure you follow these instructions: If you will be having surgery, pay special attention to the part of your body where you will be having surgery. Scrub this area for at least 1 minute. Do not use CHG on your head or face. If the solution gets into your ears or eyes, rinse them well with water. Avoid your genital area. Avoid any areas of skin that have broken skin, cuts, or scrapes. Scrub your back and under your arms. Make sure to wash skin folds. Let the lather sit on your skin for 1-2 minutes or as long as told by your health care provider. Thoroughly rinse your entire body in the shower. Make sure that all body creases and crevices are rinsed well. Dry off with a clean towel. Do not put any substances on your body afterward--such as powder, lotion, or perfume--unless you are told to do so by your health care provider. Only use lotions that are recommended by the manufacturer.  Put on clean clothes or pajamas. If it is the night before your surgery, sleep in clean  sheets.  During a sponge bath Follow these steps when using CHG solution during a sponge bath (unless your health care provider gives you different instructions): Use your normal soap and shampoo to wash your face and hair. Pour the CHG onto a clean washcloth. Starting at your neck, lather your body down to your toes. Make sure you follow these instructions: If you will be having surgery, pay special attention to the part of your body where you will be having surgery. Scrub this area for at least 1 minute. Do not use CHG on your head or face. If the solution gets into your ears or eyes, rinse them well with water. Avoid your genital area. Avoid any areas of skin that have broken skin, cuts, or scrapes. Scrub your back and under your arms. Make sure to wash skin folds. Let the lather sit on your skin for 1-2 minutes or as long as told by your health care provider. Using a different clean, wet washcloth, thoroughly rinse your entire body. Make sure that all body creases and crevices are rinsed well. Dry off with a clean towel. Do not put any substances on your body afterward--such as powder, lotion, or perfume--unless you are told to do so by your health care provider. Only use lotions that are recommended by the manufacturer. Put on clean clothes or pajamas. If it is the night before your surgery, sleep in clean sheets. How to use CHG prepackaged cloths Only use CHG cloths as told by your health care provider, and follow the instructions on the label. Use the CHG cloth on clean, dry skin. Do not use the CHG cloth on your head or face unless your health care provider tells you to. When washing with the CHG cloth: Avoid your genital area. Avoid any areas of skin that have broken skin, cuts, or scrapes. Before surgery Follow these steps when using a CHG cloth to clean before surgery (unless your health care provider gives you different instructions): Using the CHG cloth, vigorously scrub the  part of your body where you will be having surgery. Scrub using a back-and-forth motion for 3 minutes. The area on your body should be completely wet with CHG when you are done scrubbing. Do not rinse. Discard the cloth and let the area air-dry. Do not put any substances on the area afterward, such as powder, lotion, or perfume. Put on clean clothes or pajamas. If it is the night before your surgery, sleep in clean sheets.  For general bathing Follow these steps when using CHG cloths for general bathing (unless your health care provider gives you different instructions). Use a separate CHG cloth for each area of your body. Make sure you wash between any folds of skin and between your fingers and toes. Wash your body in the following order, switching to a new cloth after each step: The front of your neck, shoulders, and chest. Both of your arms, under your arms, and your hands. Your stomach and groin area, avoiding the genitals. Your right leg and foot. Your left leg and foot. The back of your neck, your back, and your buttocks. Do not rinse. Discard the cloth and let the area air-dry. Do not put any substances on your body afterward--such as powder, lotion, or perfume--unless you are told to do so by your health care provider. Only use lotions that are recommended by the manufacturer. Put on  clean clothes or pajamas. Contact a health care provider if: Your skin gets irritated after scrubbing. You have questions about using your solution or cloth. You swallow any chlorhexidine. Call your local poison control center ([phone] in the [place] Get help right away if: Your eyes itch badly, or they become very red or swollen. Your skin itches badly and is red or swollen. Your hearing changes. You have trouble seeing. You have swelling or tingling in your mouth or throat. You have trouble breathing. These symptoms may represent a serious problem that is an emergency. Do not wait to see if the  symptoms will go away. Get medical help right away. Call your local emergency services (911 in the [place] Do not drive yourself to the hospital. Summary Chlorhexidine gluconate (CHG) is a germ-killing (antiseptic) solution that is used to clean the skin. Cleaning your skin with CHG may help to lower your risk for infection. You may be given CHG to use for bathing. It may be in a bottle or in a prepackaged cloth to use on your skin. Carefully follow your health care provider's instructions and the instructions on the product label. Do not use CHG if you have a chlorhexidine allergy. Contact your health care provider if your skin gets irritated after scrubbing. This information is not intended to replace advice given to you by your health care provider. Make sure you discuss any questions you have with your health care provider. Document Revised: 09/03/2021 Document Reviewed: 07/17/2020 Elsevier Patient Education  2023 [employer]

## 2022-03-11 ENCOUNTER — Encounter (HOSPITAL_COMMUNITY)
Admission: RE | Admit: 2022-03-11 | Discharge: 2022-03-11 | Disposition: A | Discharge status: Home / Self Care | Payer: No Typology Code available for payment source | Source: Ambulatory Visit | Attending: Orthopedic Surgery | Admitting: Orthopedic Surgery

## 2022-03-11 VITALS — BP 145/96 | HR 90 | Temp 97.8°F | Resp 18 | Ht 68.5 in | Wt 339.9 lb

## 2022-03-11 DIAGNOSIS — M23321 Other meniscus derangements, posterior horn of medial meniscus, right knee: Secondary | ICD-10-CM | POA: Insufficient documentation

## 2022-03-11 DIAGNOSIS — I10 Essential (primary) hypertension: Secondary | ICD-10-CM | POA: Diagnosis not present

## 2022-03-11 DIAGNOSIS — R7303 Prediabetes: Secondary | ICD-10-CM | POA: Insufficient documentation

## 2022-03-11 DIAGNOSIS — Z01818 Encounter for other preprocedural examination: Secondary | ICD-10-CM | POA: Insufficient documentation

## 2022-03-11 LAB — BASIC METABOLIC PANEL
Anion gap: 8 (ref 5–15)
BUN: 15 mg/dL (ref 6–20)
CO2: 27 mmol/L (ref 22–32)
Calcium: 8.8 mg/dL — ABNORMAL LOW (ref 8.9–10.3)
Chloride: 105 mmol/L (ref 98–111)
Creatinine, Ser: 0.57 mg/dL (ref 0.44–1.00)
GFR, Estimated: >60 mL/min (ref >60)
Glucose, Bld: 118 mg/dL — ABNORMAL HIGH (ref 70–99)
Potassium: 3.9 mmol/L (ref 3.5–5.1)
Sodium: 140 mmol/L (ref 135–145)

## 2022-03-11 LAB — CBC WITH DIFFERENTIAL/PLATELET
Abs Immature Granulocytes: 0.02 10*3/uL (ref 0.00–0.07)
Basophils Absolute: 0 10*3/uL (ref 0.0–0.1)
Basophils Relative: 0 %
Eosinophils Absolute: 0.1 10*3/uL (ref 0.0–0.5)
Eosinophils Relative: 2 %
HCT: 38.5 % (ref 36.0–46.0)
Hemoglobin: 12.4 g/dL (ref 12.0–15.0)
Immature Granulocytes: 0 %
Lymphocytes Relative: 44 %
Lymphs Abs: 2.5 10*3/uL (ref 0.7–4.0)
MCH: 28.2 pg (ref 26.0–34.0)
MCHC: 32.2 g/dL (ref 30.0–36.0)
MCV: 87.7 fL (ref 80.0–100.0)
Monocytes Absolute: 0.5 10*3/uL (ref 0.1–1.0)
Monocytes Relative: 8 %
Neutro Abs: 2.6 10*3/uL (ref 1.7–7.7)
Neutrophils Relative %: 46 %
Platelets: 245 10*3/uL (ref 150–400)
RBC: 4.39 MIL/uL (ref 3.87–5.11)
RDW: 14.1 % (ref 11.5–15.5)
WBC: 5.7 10*3/uL (ref 4.0–10.5)
nRBC: 0 % (ref 0.0–0.2)

## 2022-03-11 LAB — HEMOGLOBIN A1C
Hgb A1c MFr Bld: 6.4 % — ABNORMAL HIGH (ref 4.8–5.6)
Mean Plasma Glucose: 136.98 mg/dL

## 2022-03-11 NOTE — H&P (Signed)
Chief Complaint  Patient presents with   Results      Review MRI right knee      Amy [name] has had her MRI she still having symptomatic right knee pain which is causing her to limp.  Her pain is not improved her range of motion is still less than normal and the knee feels like it will give way   System review no specific findings at this time  Past Medical History:  Diagnosis Date   Anemia    Diabetes mellitus without complication ([place])    PRE-DIABETIC, NO LONGER NEED MEDICATION   Essential hypertension 09/28/2015   Hypertension    Leg swelling    PONV (postoperative nausea and vomiting)    Pre-syncope 09/28/2015   PVC (premature ventricular contraction) 09/28/2015   Vitamin D deficiency    Past Surgical History:  Procedure Laterality Date   ABDOMINAL HYSTERECTOMY Bilateral 03/27/2015   Procedure: HYSTERECTOMY ABDOMINAL WITH BILATERAL SALPINGECTOMY;  Surgeon: Arvella Nigh, MD;  Location: [facility];  Service: Gynecology;  Laterality: Bilateral;   CESAREAN SECTION     COLONOSCOPY WITH PROPOFOL N/A 12/16/2019   Procedure: COLONOSCOPY WITH PROPOFOL;  Surgeon: Ronnette Juniper, MD;  Location: [facility];  Service: Gastroenterology;  Laterality: N/A;   LAPAROSCOPIC VAGINAL HYSTERECTOMY WITH SALPINGECTOMY Bilateral 03/27/2015   Procedure: ATTEMPTED LAPAROSCOPIC ASSISTED VAGINAL HYSTERECTOMY WITH BILATERAL SALPINGECTOMY;  Surgeon: Arvella Nigh, MD;  Location: [facility];  Service: Gynecology;  Laterality: Bilateral;   TUBAL LIGATION     WISDOM TOOTH EXTRACTION     Social History   Tobacco Use   Smoking status: Never   Smokeless tobacco: Never  Substance Use Topics   Alcohol use: Yes    Comment: occasionally (couples times a year)   Drug use: No   Family History  Problem Relation Age of Onset   Hypertension Mother    Diabetes Mother    Dementia Father    Diabetes Sister    Hypertension Sister    Hypertension Maternal Grandmother    Arthritis Maternal Grandmother    Diabetes Paternal  Grandmother    Stroke Paternal Grandmother    Hypertension Sister    No orders of the defined types were placed in this encounter.  No current facility-administered medications for this encounter.  Current Outpatient Medications:    acetaminophen (TYLENOL) 500 MG tablet, Take 1,000 mg by mouth every 6 (six) hours as needed for moderate pain or headache., Disp: , Rfl:    Ascorbic Acid (VITAMIN C) 1000 MG tablet, Take 1,000 mg by mouth daily., Disp: , Rfl:    Cholecalciferol (DIALYVITE VITAMIN D 5000) 125 MCG (5000 UT) capsule, Take 5,000 Units by mouth daily., Disp: , Rfl:    fexofenadine-pseudoephedrine (ALLEGRA-D 24) 180-240 MG 24 hr tablet, Take 1 tablet by mouth daily as needed (allergies)., Disp: , Rfl:    hydrochlorothiazide (MICROZIDE) 12.5 MG capsule, Take 1 capsule (12.5 mg total) by mouth daily as needed (FOR SWELLING OR ELEVATED BLOOD PRESSURE). (Patient taking differently: Take 12.5 mg by mouth daily as needed (swelling).), Disp: 90 capsule, Rfl: 1   ibuprofen (ADVIL,MOTRIN) 200 MG tablet, Take 400-800 mg by mouth as needed for headache or moderate pain. , Disp: , Rfl:    Multiple Vitamin (MULTIVITAMIN WITH MINERALS) TABS tablet, Take 1 tablet by mouth daily after breakfast., Disp: , Rfl:    pravastatin (PRAVACHOL) 10 MG tablet, Take 20 mg by mouth every evening., Disp: , Rfl:    Physical Exam Vitals reviewed: WELLDEVELOPED WELL NOURISHED NO CONGENITAL ABNORMALITIES.  Cardiovascular:     Pulses: Normal pulses.     Comments: NO SWELLING OR VARICOSITIES  Musculoskeletal:     Right knee: Normal.     Left knee: Normal.     Comments: Slightly altered gait  Right knee medial joint line tenderness some restrictions in motion extension and flexion but overall 125 degrees with stable ligaments and no muscle abnormalities  Skin:    General: Skin is warm and dry.     Capillary Refill: Capillary refill takes less than 2 seconds.     Findings: No bruising, erythema or rash.   Neurological:     General: No focal deficit present.     Mental Status: She is oriented to person, place, and time.     Comments: NORMAL SENSATION IN BOTH LOWER EXTREMITIES   Psychiatric:        Mood and Affect: Mood normal.        Behavior: Behavior normal.        Thought Content: Thought content normal.        Judgment: Judgment normal.     I went over the MRI findings with her   We discussed the arthritis and torn meniscus and how that would affect long-term health of her knee   3 to 5-year.  May require knee replacement although she will get improvement after meniscectomy   We are recommending that she have arthroscopic surgery of the right knee and a partial medial meniscectomy with a 2 to 3-day time out of work as she works from home       Encounter Diagnoses  Name Primary?   Other meniscus derangements, posterior horn of medial meniscus, right knee Yes   Primary localized osteoarthritis of knee      Plan is for arthroscopic surgery right knee partial medial meniscectomy

## 2022-03-12 ENCOUNTER — Ambulatory Visit (HOSPITAL_COMMUNITY): Payer: No Typology Code available for payment source | Admitting: Anesthesiology

## 2022-03-12 ENCOUNTER — Encounter (HOSPITAL_COMMUNITY): Payer: Self-pay | Admitting: Orthopedic Surgery

## 2022-03-12 ENCOUNTER — Telehealth: Payer: Self-pay | Admitting: Orthopedic Surgery

## 2022-03-12 ENCOUNTER — Encounter (HOSPITAL_COMMUNITY)
Admission: RE | Disposition: A | Discharge status: Home / Self Care | Payer: Self-pay | Source: Home / Self Care | Attending: Orthopedic Surgery

## 2022-03-12 ENCOUNTER — Ambulatory Visit (HOSPITAL_BASED_OUTPATIENT_CLINIC_OR_DEPARTMENT_OTHER): Payer: No Typology Code available for payment source | Admitting: Anesthesiology

## 2022-03-12 ENCOUNTER — Ambulatory Visit (HOSPITAL_COMMUNITY)
Admission: RE | Admit: 2022-03-12 | Discharge: 2022-03-12 | Disposition: A | Discharge status: Home / Self Care | Payer: No Typology Code available for payment source | Attending: Orthopedic Surgery | Admitting: Orthopedic Surgery

## 2022-03-12 ENCOUNTER — Other Ambulatory Visit: Payer: Self-pay

## 2022-03-12 ENCOUNTER — Encounter: Payer: Self-pay | Admitting: Orthopedic Surgery

## 2022-03-12 DIAGNOSIS — Z6841 Body Mass Index (BMI) 40.0 and over, adult: Secondary | ICD-10-CM

## 2022-03-12 DIAGNOSIS — X58XXXA Exposure to other specified factors, initial encounter: Secondary | ICD-10-CM | POA: Diagnosis not present

## 2022-03-12 DIAGNOSIS — S83241A Other tear of medial meniscus, current injury, right knee, initial encounter: Secondary | ICD-10-CM | POA: Insufficient documentation

## 2022-03-12 DIAGNOSIS — E119 Type 2 diabetes mellitus without complications: Secondary | ICD-10-CM | POA: Diagnosis not present

## 2022-03-12 DIAGNOSIS — S83241D Other tear of medial meniscus, current injury, right knee, subsequent encounter: Secondary | ICD-10-CM

## 2022-03-12 DIAGNOSIS — I10 Essential (primary) hypertension: Secondary | ICD-10-CM | POA: Diagnosis not present

## 2022-03-12 HISTORY — PX: KNEE ARTHROSCOPY WITH MEDIAL MENISECTOMY: SHX5651

## 2022-03-12 SURGERY — ARTHROSCOPY, KNEE, WITH MEDIAL MENISCECTOMY
Anesthesia: General | Site: Knee | Laterality: Right

## 2022-03-12 MED ORDER — DEXAMETHASONE SODIUM PHOSPHATE 10 MG/ML IJ SOLN
INTRAMUSCULAR | Status: DC | PRN
  Administered 2022-03-12: 10 mg via INTRAVENOUS

## 2022-03-12 MED ORDER — PROPOFOL 10 MG/ML IV BOLUS
INTRAVENOUS | Status: DC | PRN
  Administered 2022-03-12: 200 mg via INTRAVENOUS

## 2022-03-12 MED ORDER — DEXAMETHASONE SODIUM PHOSPHATE 10 MG/ML IJ SOLN
INTRAMUSCULAR | Status: AC
  Filled 2022-03-12: qty 1

## 2022-03-12 MED ORDER — ONDANSETRON HCL 4 MG/2ML IJ SOLN
INTRAMUSCULAR | Status: AC
  Filled 2022-03-12: qty 2

## 2022-03-12 MED ORDER — PROPOFOL 500 MG/50ML IV EMUL
INTRAVENOUS | Status: AC
  Filled 2022-03-12: qty 150

## 2022-03-12 MED ORDER — LIDOCAINE HCL (PF) 2 % IJ SOLN
INTRAMUSCULAR | Status: AC
  Filled 2022-03-12: qty 5

## 2022-03-12 MED ORDER — ONDANSETRON HCL 4 MG/2ML IJ SOLN
INTRAMUSCULAR | Status: DC | PRN
  Administered 2022-03-12: 4 mg via INTRAVENOUS

## 2022-03-12 MED ORDER — FENTANYL CITRATE (PF) 250 MCG/5ML IJ SOLN
INTRAMUSCULAR | Status: AC
  Filled 2022-03-12: qty 5

## 2022-03-12 MED ORDER — LACTATED RINGERS IV SOLN: INTRAVENOUS | Status: DC

## 2022-03-12 MED ORDER — LIDOCAINE 2% (20 MG/ML) 5 ML SYRINGE
INTRAMUSCULAR | Status: DC | PRN
  Administered 2022-03-12: 100 mg via INTRAVENOUS

## 2022-03-12 MED ORDER — OXYCODONE HCL 5 MG PO TABS: 5.0000 mg | ORAL_TABLET | Freq: Once | ORAL | Status: DC | PRN

## 2022-03-12 MED ORDER — ORAL CARE MOUTH RINSE: 15.0000 mL | Freq: Once | OROMUCOSAL | Status: AC

## 2022-03-12 MED ORDER — BUPIVACAINE-EPINEPHRINE (PF) 0.5% -1:200000 IJ SOLN
INTRAMUSCULAR | Status: AC
  Filled 2022-03-12: qty 30

## 2022-03-12 MED ORDER — MIDAZOLAM HCL 2 MG/2ML IJ SOLN
INTRAMUSCULAR | Status: AC
  Filled 2022-03-12: qty 2

## 2022-03-12 MED ORDER — PHENYLEPHRINE 80 MCG/ML (10ML) SYRINGE FOR IV PUSH (FOR BLOOD PRESSURE SUPPORT)
PREFILLED_SYRINGE | INTRAVENOUS | Status: DC | PRN
  Administered 2022-03-12: 160 ug via INTRAVENOUS

## 2022-03-12 MED ORDER — HYDROCODONE-ACETAMINOPHEN 5-325 MG PO TABS
ORAL_TABLET | ORAL | Status: AC
  Filled 2022-03-12: qty 1

## 2022-03-12 MED ORDER — OXYCODONE HCL 5 MG/5ML PO SOLN: 5.0000 mg | Freq: Once | ORAL | Status: DC | PRN

## 2022-03-12 MED ORDER — ONDANSETRON HCL 4 MG/2ML IJ SOLN
4.0000 mg | Freq: Once | INTRAMUSCULAR | Status: DC | PRN
  Filled 2022-03-12: qty 2

## 2022-03-12 MED ORDER — HYDROCODONE-ACETAMINOPHEN 5-325 MG PO TABS
1.0000 | ORAL_TABLET | Freq: Once | ORAL | Status: AC
  Administered 2022-03-12: 1 via ORAL

## 2022-03-12 MED ORDER — FENTANYL CITRATE (PF) 250 MCG/5ML IJ SOLN
INTRAMUSCULAR | Status: DC | PRN
  Administered 2022-03-12 (×3): 50 ug via INTRAVENOUS
  Administered 2022-03-12 (×2): 25 ug via INTRAVENOUS
  Administered 2022-03-12: 50 ug via INTRAVENOUS

## 2022-03-12 MED ORDER — CHLORHEXIDINE GLUCONATE 0.12 % MT SOLN
15.0000 mL | Freq: Once | OROMUCOSAL | Status: AC
  Administered 2022-03-12: 15 mL via OROMUCOSAL

## 2022-03-12 MED ORDER — ACETAMINOPHEN 500 MG PO TABS
500.0000 mg | ORAL_TABLET | Freq: Once | ORAL | Status: AC
  Administered 2022-03-12: 500 mg via ORAL
  Filled 2022-03-12: qty 1

## 2022-03-12 MED ORDER — HYDROCODONE-ACETAMINOPHEN 5-325 MG PO TABS: 1.0000 | ORAL_TABLET | ORAL | 0 refills | Status: DC | PRN

## 2022-03-12 MED ORDER — ONDANSETRON HCL 4 MG/2ML IJ SOLN
4.0000 mg | Freq: Once | INTRAMUSCULAR | Status: AC
  Administered 2022-03-12: 4 mg via INTRAVENOUS

## 2022-03-12 MED ORDER — PHENYLEPHRINE 80 MCG/ML (10ML) SYRINGE FOR IV PUSH (FOR BLOOD PRESSURE SUPPORT)
PREFILLED_SYRINGE | INTRAVENOUS | Status: AC
  Filled 2022-03-12: qty 10

## 2022-03-12 MED ORDER — PROPOFOL 10 MG/ML IV BOLUS
INTRAVENOUS | Status: AC
  Filled 2022-03-12: qty 20

## 2022-03-12 MED ORDER — SODIUM CHLORIDE 0.9 % IR SOLN
Status: DC | PRN
  Administered 2022-03-12: 1000 mL

## 2022-03-12 MED ORDER — BUPIVACAINE-EPINEPHRINE (PF) 0.5% -1:200000 IJ SOLN
INTRAMUSCULAR | Status: DC | PRN
  Administered 2022-03-12: 30 mL via PERINEURAL

## 2022-03-12 MED ORDER — SODIUM CHLORIDE 0.9 % IR SOLN
Status: DC | PRN
  Administered 2022-03-12 (×2): 3000 mL

## 2022-03-12 MED ORDER — MIDAZOLAM HCL 5 MG/5ML IJ SOLN
INTRAMUSCULAR | Status: DC | PRN
  Administered 2022-03-12: 2 mg via INTRAVENOUS

## 2022-03-12 MED ORDER — EPINEPHRINE PF 1 MG/ML IJ SOLN
INTRAMUSCULAR | Status: AC
  Filled 2022-03-12: qty 8

## 2022-03-12 MED ORDER — CEFAZOLIN SODIUM-DEXTROSE 2-4 GM/100ML-% IV SOLN: 2.0000 g | INTRAVENOUS | Status: DC

## 2022-03-12 MED ORDER — ENOXAPARIN SODIUM 40 MG/0.4ML IJ SOSY
40.0000 mg | PREFILLED_SYRINGE | Freq: Once | INTRAMUSCULAR | Status: AC
  Administered 2022-03-12: 40 mg via SUBCUTANEOUS
  Filled 2022-03-12: qty 0.4

## 2022-03-12 MED ORDER — FENTANYL CITRATE PF 50 MCG/ML IJ SOSY
25.0000 ug | PREFILLED_SYRINGE | INTRAMUSCULAR | Status: DC | PRN
  Administered 2022-03-12: 50 ug via INTRAVENOUS
  Filled 2022-03-12: qty 1

## 2022-03-12 MED ORDER — CEFAZOLIN IN SODIUM CHLORIDE 3-0.9 GM/100ML-% IV SOLN
3.0000 g | INTRAVENOUS | Status: AC
  Administered 2022-03-12: 3 g via INTRAVENOUS
  Filled 2022-03-12: qty 100

## 2022-03-12 MED ORDER — CEFAZOLIN SODIUM-DEXTROSE 2-4 GM/100ML-% IV SOLN
INTRAVENOUS | Status: AC
  Filled 2022-03-12: qty 100

## 2022-03-12 SURGICAL SUPPLY — 39 items
APL PRP STRL LF DISP 70% ISPRP (MISCELLANEOUS) ×1
BAG HAMPER (MISCELLANEOUS) ×1 IMPLANT
BNDG CMPR STD VLCR NS LF 5.8X6 (GAUZE/BANDAGES/DRESSINGS) ×1
BNDG ELASTIC 6X5.8 VLCR NS LF (GAUZE/BANDAGES/DRESSINGS) ×1 IMPLANT
BNDG ELASTIC 6X5.8 VLCR STR LF (GAUZE/BANDAGES/DRESSINGS) IMPLANT
CHLORAPREP W/TINT 26 (MISCELLANEOUS) ×1 IMPLANT
CLOTH BEACON ORANGE TIMEOUT ST (SAFETY) ×1 IMPLANT
COOLER ICEMAN CLASSIC (MISCELLANEOUS) ×1 IMPLANT
EXCALIBUR CVD 4.0MM X 13CM (MISCELLANEOUS) IMPLANT
GAUZE SPONGE 4X4 12PLY STRL (GAUZE/BANDAGES/DRESSINGS) ×1 IMPLANT
GAUZE XEROFORM 5X9 LF (GAUZE/BANDAGES/DRESSINGS) ×1 IMPLANT
GLOVE BIOGEL PI IND STRL 7.0 (GLOVE) ×2 IMPLANT
GLOVE SS N UNI LF 8.5 STRL (GLOVE) ×1 IMPLANT
GLOVE SURG POLYISO LF SZ8 (GLOVE) ×1 IMPLANT
GOWN STRL REUS W/TWL LRG LVL3 (GOWN DISPOSABLE) ×1 IMPLANT
GOWN STRL REUS W/TWL XL LVL3 (GOWN DISPOSABLE) ×1 IMPLANT
IV NS IRRIG 3000ML ARTHROMATIC (IV SOLUTION) ×2 IMPLANT
KIT BLADEGUARD II DBL (SET/KITS/TRAYS/PACK) ×1 IMPLANT
KIT TURNOVER CYSTO (KITS) ×1 IMPLANT
MANIFOLD NEPTUNE II (INSTRUMENTS) ×1 IMPLANT
MARKER SKIN DUAL TIP RULER LAB (MISCELLANEOUS) ×1 IMPLANT
NDL HYPO 18GX1.5 BLUNT FILL (NEEDLE) ×1 IMPLANT
NDL HYPO 21X1.5 SAFETY (NEEDLE) ×1 IMPLANT
NEEDLE HYPO 18GX1.5 BLUNT FILL (NEEDLE) ×1 IMPLANT
NEEDLE HYPO 21X1.5 SAFETY (NEEDLE) ×1 IMPLANT
NS IRRIG 1000ML POUR BTL (IV SOLUTION) ×1 IMPLANT
PACK ARTHRO LIMB DRAPE STRL (MISCELLANEOUS) ×1 IMPLANT
PACK ARTHROSCOPY WL (CUSTOM PROCEDURE TRAY) ×1 IMPLANT
PAD ABD 5X9 TENDERSORB (GAUZE/BANDAGES/DRESSINGS) ×1 IMPLANT
PAD ARMBOARD 7.5X6 YLW CONV (MISCELLANEOUS) ×1 IMPLANT
PAD COLD SHLDR SM WRAP-ON (PAD) IMPLANT
PAD FOR LEG HOLDER (MISCELLANEOUS) ×1 IMPLANT
PADDING CAST COTTON 6X4 STRL (CAST SUPPLIES) ×1 IMPLANT
SET ARTHROSCOPY INST (INSTRUMENTS) ×1 IMPLANT
SET BASIN LINEN APH (SET/KITS/TRAYS/PACK) ×1 IMPLANT
SUT ETHILON 3 0 FSL (SUTURE) ×1 IMPLANT
SYR 10ML LL (SYRINGE) ×1 IMPLANT
SYR 30ML LL (SYRINGE) ×2 IMPLANT
TUBING IN/OUT FLOW W/MAIN PUMP (TUBING) ×1 IMPLANT

## 2022-03-12 NOTE — Op Note (Signed)
03/12/2022  11:37 AM  PATIENT:  Amy [name]  50 y.o. 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  Osteophyte surrounding the notch.  ACL PCL intact.  Medial meniscus posterior horn tear.  Grade 3 arthritis medial femoral condyle.  1 grade 4 lesion lateral femoral condyle nonweightbearing surface lateral meniscus intact remaining chondral surfaces on the lateral side normal  Mild synovitis  Procedure done as follows.  The patient was seen in the preop area standard preop evaluation was performed patient was cleared for surgery.  Surgical site confirmed right knee surgeons initials applied.  Chart review and image review completed  Patient taken to the operating for general anesthesia.  She was in the supine position.  We did use an arthroscopic leg holder but no tourniquet  The left leg was placed in a padded well leg holder with the knee in flexion  Sterile prep and drape was performed.  Timeout was completed  Standard lateral portal was established scope was placed into the joint a diagnostic arthroscopy was performed evaluating each compartment.  We then used a spinal needle to make a medial portal.  We released the deep portion of the MCL to gain better access to the posterior medial portion of the joint.  The meniscus tear was encountered and resected with a straight biting forceps followed by removal of the fragments with a motorized shaver.  Note at this point the shaver stopped working.  We worked on the shaver the blade the foot paddle and the box including exchanging the box and got it to work to finish the case.    We balanced meniscus with a curved shaver blade  and remove remaining meniscal fragments.  We irrigated the joint suctioned clean and dry and then closed his portals with 3-0 nylon sutures we injected the joint with 30 cc of Marcaine with epinephrine    PHYSICIAN ASSISTANT:   ASSISTANTS: none   ANESTHESIA:   general  EBL:  20 mL   BLOOD ADMINISTERED:none  DRAINS: none   LOCAL MEDICATIONS USED:  MARCAINE     SPECIMEN:  No Specimen  DISPOSITION OF SPECIMEN:  N/A  COUNTS:  YES  TOURNIQUET:  * No tourniquets in log *  DICTATION: .Dragon Dictation  PLAN OF CARE: Discharge to home after PACU  PATIENT DISPOSITION:  PACU - hemodynamically stable.   Delay start of Pharmacological VTE agent (>24hrs) due to surgical blood loss or risk of bleeding: not applicable  Postop plan  full weightbearing  follow up in a week 1 dose of Lovenox postop secondary to BMI 50.94 No bracing required

## 2022-03-12 NOTE — Anesthesia Preprocedure Evaluation (Signed)
Anesthesia Evaluation  Patient identified by MRN, date of birth, ID band Patient awake    Reviewed: Allergy & Precautions, H&P , NPO status , Patient's Chart, lab work & pertinent test results, reviewed documented beta blocker date and time   History of Anesthesia Complications (+) PONV and history of anesthetic complications  Airway Mallampati: II  TM Distance: >3 FB Neck ROM: full    Dental no notable dental hx.    Pulmonary neg pulmonary ROS,    Pulmonary exam normal breath sounds clear to auscultation       Cardiovascular Exercise Tolerance: Good hypertension, negative cardio ROS   Rhythm:regular Rate:Normal     Neuro/Psych negative neurological ROS  negative psych ROS   GI/Hepatic negative GI ROS, Neg liver ROS,   Endo/Other  diabetes, Type 2Morbid obesity  Renal/GU negative Renal ROS  negative genitourinary   Musculoskeletal   Abdominal   Peds  Hematology  (+) Blood dyscrasia, anemia ,   Anesthesia Other Findings   Reproductive/Obstetrics negative OB ROS                             Anesthesia Physical Anesthesia Plan  ASA: 3  Anesthesia Plan: General and General LMA   Post-op Pain Management:    Induction:   PONV Risk Score and Plan: Ondansetron  Airway Management Planned:   Additional Equipment:   Intra-op Plan:   Post-operative Plan:   Informed Consent: I have reviewed the patients History and Physical, chart, labs and discussed the procedure including the risks, benefits and alternatives for the proposed anesthesia with the patient or authorized representative who has indicated his/her understanding and acceptance.     Dental Advisory Given  Plan Discussed with: CRNA  Anesthesia Plan Comments:         Anesthesia Quick Evaluation

## 2022-03-12 NOTE — Progress Notes (Signed)
Instructed on incentive spirometer. 2500 ml obtained. Tolerated well. 

## 2022-03-12 NOTE — Brief Op Note (Signed)
03/12/2022  11:37 AM  PATIENT:  Amy [name]  51 y.o. female  PRE-OPERATIVE DIAGNOSIS:  torn medial meniscus right knee  POST-OPERATIVE DIAGNOSIS:  torn medial meniscus right knee  PROCEDURE:  Procedure(s): KNEE ARTHROSCOPY WITH MEDIAL MENISECTOMY (Right)  SURGEON:  Surgeon(s) and Role:    Carole Civil, MD - Primary  PHYSICIAN ASSISTANT:   ASSISTANTS: none   ANESTHESIA:   general  EBL:  20 mL   BLOOD ADMINISTERED:none  DRAINS: none   LOCAL MEDICATIONS USED:  MARCAINE     SPECIMEN:  No Specimen  DISPOSITION OF SPECIMEN:  N/A  COUNTS:  YES  TOURNIQUET:  * No tourniquets in log *  DICTATION: .Dragon Dictation  PLAN OF CARE: Discharge to home after PACU  PATIENT DISPOSITION:  PACU - hemodynamically stable.   Delay start of Pharmacological VTE agent (>24hrs) due to surgical blood loss or risk of bleeding: not applicable  Postop plan  full weightbearing  follow up in a week 1 dose of Lovenox postop secondary to BMI 50.94 No bracing required

## 2022-03-12 NOTE — Anesthesia Procedure Notes (Signed)
Procedure Name: LMA Insertion Date/Time: 03/12/2022 10:51 AM  Performed by: Myna Bright, CRNAPre-anesthesia Checklist: Patient identified, Emergency Drugs available, Suction available and Patient being monitored Patient Re-evaluated:Patient Re-evaluated prior to induction Oxygen Delivery Method: Circle system utilized Preoxygenation: Pre-oxygenation with 100% oxygen Induction Type: IV induction Ventilation: Mask ventilation without difficulty LMA: LMA with gastric port inserted LMA Size: 4.0 Tube type: Oral Number of attempts: 1 Placement Confirmation: positive ETCO2 and breath sounds checked- equal and bilateral Tube secured with: Tape Dental Injury: Teeth and Oropharynx as per pre-operative assessment

## 2022-03-12 NOTE — Telephone Encounter (Addendum)
Patient called back - requesting medication for nausea. States uses [facility] [address]. In addition, patient states she was notified by [facility] location that they are   OUT of the medication prescribed by Dr Aline Brochure post surgery today:  HYDROcodone-acetaminophen (NORCO/VICODIN) 5-325 MG tablet.    - states pharmacy was contacting Dr Aline Brochure for a possible alternative.

## 2022-03-12 NOTE — Transfer of Care (Signed)
Immediate Anesthesia Transfer of Care Note  Patient: Amy [name]  Procedure(s) Performed: KNEE ARTHROSCOPY WITH MEDIAL MENISECTOMY (Right: Knee)  Patient Location: PACU  Anesthesia Type:General  Level of Consciousness: awake, alert , oriented and patient cooperative  Airway & Oxygen Therapy: Patient Spontanous Breathing and Patient connected to face mask oxygen  Post-op Assessment: Report given to RN, Post -op Vital signs reviewed and stable and Patient moving all extremities  Post vital signs: Reviewed and stable  Last Vitals:  Vitals Value Taken Time  BP 166/102 03/12/22 1145  Temp    Pulse 86 03/12/22 1146  Resp 13 03/12/22 1146  SpO2 99 % 03/12/22 1146  Vitals shown include unvalidated device data.  Last Pain:  Vitals:   03/12/22 0930  PainSc: 1          Complications: No notable events documented.

## 2022-03-12 NOTE — Telephone Encounter (Signed)
Call via voice message received from patient - asking about her medication prescribed today - [facility] [address]. No other details could be heard on message. Prior to this message, Amy [name] from [facility] left a message about patient needing a prescription for a walker.  Called back to patient - reached voice mail, left message to call back as soon as possible in order for [place] to address her questions.

## 2022-03-12 NOTE — Interval H&P Note (Signed)
History and Physical Interval Note:  03/12/2022 10:37 AM  Amy [name]  has presented today for surgery, with the diagnosis of torn medial meniscus right knee.  The various methods of treatment have been discussed with the patient and family. After consideration of risks, benefits and other options for treatment, the patient has consented to  Procedure(s): KNEE ARTHROSCOPY WITH MEDIAL MENISECTOMY (Right) as a surgical intervention.  The patient's history has been reviewed, patient examined, no change in status, stable for surgery.  I have reviewed the patient's chart and labs.  Questions were answered to the patient's satisfaction.     Arther Abbott

## 2022-03-13 ENCOUNTER — Telehealth: Payer: Self-pay | Admitting: Orthopedic Surgery

## 2022-03-13 MED ORDER — HYDROCODONE-ACETAMINOPHEN 5-325 MG PO TABS: 1.0000 | ORAL_TABLET | ORAL | 0 refills | Status: AC | PRN

## 2022-03-13 MED ORDER — PROMETHAZINE HCL 12.5 MG PO TABS: 12.5000 mg | ORAL_TABLET | Freq: Four times a day (QID) | ORAL | 0 refills | Status: DC | PRN

## 2022-03-13 NOTE — Telephone Encounter (Signed)
Patient returned call, per my attempt to reach in response to phone messages to and from patient, and providers:   1 - Patient aware of prescription for nausea being ready at [facility] [address], promethazine (PHENERGAN) 12.5 MG tablet 20 tablet          2 - [facility] relays [facility] [address] [facility] [place] has the pain medication in stock:  HYDROcodone-acetaminophen (NORCO/VICODIN) 5-325 MG tablet 18 tablet      Please send prescription there   - note: patient wanted Drs to know that she has been able to keep pain manageable with Tylenol and ibuprofen, and with using ice

## 2022-03-13 NOTE — Telephone Encounter (Signed)
[facility] called [facility] [address] We do not have a provider here now that can send in the meds They are expecting the meds to arrive at any time and will let her know when they are there  I will call her.

## 2022-03-13 NOTE — Telephone Encounter (Signed)
Wow this got passed around yesterday She is asking for a walker I will call her back about this today.

## 2022-03-13 NOTE — Telephone Encounter (Signed)
Patient called back and left message -  I returned call. [facility] [address] still [facility] prescription from our office for the pain medication.

## 2022-03-13 NOTE — Telephone Encounter (Signed)
Regarding message about the walker, [facility], per Terrence Dupont, they do not carry bariatric durable medical equipment. Patient is aware. Also spoke with patient - said she will continue to be careful, ambulating with help from son.

## 2022-03-13 NOTE — Telephone Encounter (Signed)
I have called her pharmacy again to see if meds there and they are not.

## 2022-03-13 NOTE — Telephone Encounter (Signed)
1st message she wanted a walker  He sent in the Hydrocodone already with 2nd message 3rd message she wants nausea medicine  I will call her about the above to discuss a walker, which I already gave her an order for and she forgot to get it.  I will make sure she has gotten the pain meds  WILL you please ok for me to send in some phenergan for her? That's what H normally uses [facility]  That was a lot of messaging.

## 2022-03-13 NOTE — Telephone Encounter (Signed)
Noted  

## 2022-03-14 NOTE — Anesthesia Postprocedure Evaluation (Signed)
Anesthesia Post Note  Patient: Amy [name]  Procedure(s) Performed: KNEE ARTHROSCOPY WITH MEDIAL MENISECTOMY (Right: Knee)  Patient location during evaluation: Phase II Anesthesia Type: General Level of consciousness: awake Pain management: pain level controlled Vital Signs Assessment: post-procedure vital signs reviewed and stable Respiratory status: spontaneous breathing and respiratory function stable Cardiovascular status: blood pressure returned to baseline and stable Postop Assessment: no headache and no apparent nausea or vomiting Anesthetic complications: no Comments: Late entry   No notable events documented.   Last Vitals:  Vitals:   03/12/22 1300 03/12/22 1301  BP: (!) 145/88 (!) 145/88  Pulse: 81 87  Resp: 17 19  Temp:    SpO2: 94% 94%    Last Pain:  Vitals:   03/12/22 1258  TempSrc: Oral  PainSc: [place]

## 2022-03-14 NOTE — Telephone Encounter (Signed)
Patient verified she did get the medication yesterday, at the [facility] on [address] as originally sent, as they did get some in. States feeling better today, and able to work from home today since doing better.

## 2022-03-15 ENCOUNTER — Encounter (HOSPITAL_COMMUNITY): Payer: Self-pay | Admitting: Orthopedic Surgery

## 2022-03-15 NOTE — Telephone Encounter (Signed)
Done

## 2022-03-19 NOTE — Progress Notes (Deleted)
Postop visit #1  7 days postop status post right knee arthroscopy  Diagnosis torn medial meniscus  Operative findings  Global arthritis  Patellofemoral joint: Grade 4  Notch ACL PCL intact,  Medial side: Medial meniscus posterior horn tear, grade 3 arthritis medial femoral condyle  Lateral compartment grade 4 lesion lateral femoral condyle nonweightbearing surface, lateral meniscus intact, tibial plateau normal

## 2022-03-20 ENCOUNTER — Encounter: Payer: No Typology Code available for payment source | Admitting: Orthopedic Surgery

## 2022-03-20 DIAGNOSIS — Z9889 Other specified postprocedural states: Secondary | ICD-10-CM | POA: Insufficient documentation

## 2022-03-21 ENCOUNTER — Encounter: Payer: Self-pay | Admitting: Orthopedic Surgery

## 2022-03-21 ENCOUNTER — Ambulatory Visit (INDEPENDENT_AMBULATORY_CARE_PROVIDER_SITE_OTHER): Payer: No Typology Code available for payment source | Admitting: Orthopedic Surgery

## 2022-03-21 VITALS — Ht 68.5 in | Wt 339.0 lb

## 2022-03-21 DIAGNOSIS — Z9889 Other specified postprocedural states: Secondary | ICD-10-CM

## 2022-03-21 NOTE — Progress Notes (Signed)
FOLLOW UP   Encounter Diagnosis  Name Primary?   S/P right knee arthroscopy 03/12/22  Yes     Chief Complaint  Patient presents with   Routine Post Op    Rt knee DOS 03/12/22   POST-OPERATIVE DIAGNOSIS:  torn medial meniscus right knee   PROCEDURE:  Procedure(s): KNEE ARTHROSCOPY WITH MEDIAL MENISECTOMY (Right)   SURGEON:  Surgeon(s) and Role:    Carole Civil, MD - Primary   Operative findings   Global arthritis.  Grade 4 in the patellofemoral joint.  Osteophyte surrounding the notch.  ACL PCL intact.  Medial meniscus posterior horn tear.  Grade 3 arthritis medial femoral condyle.   1 grade 4 lesion lateral femoral condyle nonweightbearing surface lateral meniscus intact remaining chondral surfaces on the lateral side normal   Mild synovitis  Amy [name] is doing pretty good as her knee bending 90 degrees portals look good  She can start her home exercise program follow-up in 3 to 4 weeks

## 2022-04-18 ENCOUNTER — Encounter: Payer: Self-pay | Admitting: Orthopedic Surgery

## 2022-04-18 ENCOUNTER — Ambulatory Visit (INDEPENDENT_AMBULATORY_CARE_PROVIDER_SITE_OTHER): Payer: No Typology Code available for payment source | Admitting: Orthopedic Surgery

## 2022-04-18 DIAGNOSIS — Z9889 Other specified postprocedural states: Secondary | ICD-10-CM

## 2022-04-18 NOTE — Patient Instructions (Signed)
Continue your exercises   Ice the knee when you see swelling   For pain take tylenol 500 mg every 6 hrs as needed

## 2022-04-18 NOTE — Progress Notes (Signed)
POST OP VISIT   Encounter Diagnosis  Name Primary?   S/P right knee arthroscopy 03/12/22  Yes    Procedure POST-OPERATIVE DIAGNOSIS:  torn medial meniscus right knee   PROCEDURE:  Procedure(s): KNEE ARTHROSCOPY WITH MEDIAL MENISECTOMY (Right)    Operative findings   Global arthritis.  Grade 4 in the patellofemoral joint.  Osteophyte surrounding the notch.  ACL PCL intact.  Medial meniscus posterior horn tear.  Grade 3 arthritis medial femoral condyle.   1 grade 4 lesion lateral femoral condyle nonweightbearing surface lateral meniscus intact remaining chondral surfaces on the lateral side normal   Mild synovitis   Amy [name] is doing pretty good as her knee bending 90 degrees portals look good   She can start her home exercise program follow-up in 3 to 4 weeks   POD  # 37  Amy [name] is happy with her knee she has some stiffness which is most likely due to arthritis  She is regained full range of motion she has good quadriceps control  She will continue to ice the knee as needed do her exercises 2-3 times a week and see [place] on an as-needed basis

## 2022-07-18 ENCOUNTER — Encounter: Payer: Self-pay | Admitting: Radiology

## 2022-10-30 ENCOUNTER — Other Ambulatory Visit: Payer: Self-pay | Admitting: Obstetrics and Gynecology

## 2022-10-30 DIAGNOSIS — E041 Nontoxic single thyroid nodule: Secondary | ICD-10-CM

## 2022-11-26 ENCOUNTER — Ambulatory Visit
Admission: RE | Admit: 2022-11-26 | Discharge: 2022-11-26 | Disposition: A | Discharge status: Home / Self Care | Payer: No Typology Code available for payment source | Source: Ambulatory Visit | Attending: Obstetrics and Gynecology | Admitting: Obstetrics and Gynecology

## 2022-11-26 DIAGNOSIS — E041 Nontoxic single thyroid nodule: Secondary | ICD-10-CM

## 2023-03-30 ENCOUNTER — Encounter (HOSPITAL_COMMUNITY): Admission: EM | Disposition: A | Discharge status: Home / Self Care | Payer: Self-pay | Source: Home / Self Care

## 2023-03-30 ENCOUNTER — Emergency Department (HOSPITAL_COMMUNITY): Payer: No Typology Code available for payment source

## 2023-03-30 ENCOUNTER — Observation Stay (HOSPITAL_COMMUNITY): Payer: No Typology Code available for payment source | Admitting: Anesthesiology

## 2023-03-30 ENCOUNTER — Inpatient Hospital Stay (HOSPITAL_COMMUNITY)
Admission: EM | Admit: 2023-03-30 | Discharge: 2023-04-01 | DRG: 398 | Disposition: A | Discharge status: Home / Self Care | Payer: No Typology Code available for payment source | Attending: General Surgery | Admitting: General Surgery

## 2023-03-30 ENCOUNTER — Other Ambulatory Visit: Payer: Self-pay

## 2023-03-30 ENCOUNTER — Encounter (HOSPITAL_COMMUNITY): Payer: Self-pay | Admitting: Emergency Medicine

## 2023-03-30 DIAGNOSIS — Z833 Family history of diabetes mellitus: Secondary | ICD-10-CM

## 2023-03-30 DIAGNOSIS — Z8249 Family history of ischemic heart disease and other diseases of the circulatory system: Secondary | ICD-10-CM | POA: Diagnosis not present

## 2023-03-30 DIAGNOSIS — E559 Vitamin D deficiency, unspecified: Secondary | ICD-10-CM | POA: Diagnosis present

## 2023-03-30 DIAGNOSIS — I10 Essential (primary) hypertension: Secondary | ICD-10-CM | POA: Diagnosis present

## 2023-03-30 DIAGNOSIS — Z888 Allergy status to other drugs, medicaments and biological substances status: Secondary | ICD-10-CM | POA: Diagnosis not present

## 2023-03-30 DIAGNOSIS — E119 Type 2 diabetes mellitus without complications: Secondary | ICD-10-CM | POA: Diagnosis present

## 2023-03-30 DIAGNOSIS — Z79899 Other long term (current) drug therapy: Secondary | ICD-10-CM | POA: Diagnosis not present

## 2023-03-30 DIAGNOSIS — E876 Hypokalemia: Secondary | ICD-10-CM | POA: Diagnosis present

## 2023-03-30 DIAGNOSIS — E860 Dehydration: Secondary | ICD-10-CM | POA: Diagnosis present

## 2023-03-30 DIAGNOSIS — K37 Unspecified appendicitis: Secondary | ICD-10-CM | POA: Diagnosis not present

## 2023-03-30 DIAGNOSIS — K3533 Acute appendicitis with perforation and localized peritonitis, with abscess: Principal | ICD-10-CM | POA: Diagnosis present

## 2023-03-30 DIAGNOSIS — Z6841 Body Mass Index (BMI) 40.0 and over, adult: Secondary | ICD-10-CM

## 2023-03-30 DIAGNOSIS — K3532 Acute appendicitis with perforation and localized peritonitis, without abscess: Secondary | ICD-10-CM | POA: Diagnosis present

## 2023-03-30 DIAGNOSIS — Z90710 Acquired absence of both cervix and uterus: Secondary | ICD-10-CM

## 2023-03-30 DIAGNOSIS — R1031 Right lower quadrant pain: Secondary | ICD-10-CM | POA: Diagnosis present

## 2023-03-30 HISTORY — PX: LAPAROSCOPIC APPENDECTOMY: SHX408

## 2023-03-30 LAB — COMPREHENSIVE METABOLIC PANEL
ALT: 30 U/L (ref 0–44)
AST: 29 U/L (ref 15–41)
Albumin: 2.9 g/dL — ABNORMAL LOW (ref 3.5–5.0)
Alkaline Phosphatase: 54 U/L (ref 38–126)
Anion gap: 10 (ref 5–15)
BUN: 8 mg/dL (ref 6–20)
CO2: 24 mmol/L (ref 22–32)
Calcium: 8.5 mg/dL — ABNORMAL LOW (ref 8.9–10.3)
Chloride: 99 mmol/L (ref 98–111)
Creatinine, Ser: 0.52 mg/dL (ref 0.44–1.00)
GFR, Estimated: >60 mL/min (ref >60)
Glucose, Bld: 131 mg/dL — ABNORMAL HIGH (ref 70–99)
Potassium: 3.3 mmol/L — ABNORMAL LOW (ref 3.5–5.1)
Sodium: 133 mmol/L — ABNORMAL LOW (ref 135–145)
Total Bilirubin: 0.8 mg/dL (ref <1.2)
Total Protein: 8.7 g/dL — ABNORMAL HIGH (ref 6.5–8.1)

## 2023-03-30 LAB — URINALYSIS, ROUTINE W REFLEX MICROSCOPIC
Bilirubin Urine: NEGATIVE
Glucose, UA: NEGATIVE mg/dL
Ketones, ur: NEGATIVE mg/dL
Nitrite: NEGATIVE
Protein, ur: 100 mg/dL — AB
Specific Gravity, Urine: 1.018 (ref 1.005–1.030)
WBC, UA: >50 WBC/hpf (ref 0–5)
pH: 6 (ref 5.0–8.0)

## 2023-03-30 LAB — CBC
HCT: 34.4 % — ABNORMAL LOW (ref 36.0–46.0)
Hemoglobin: 11.3 g/dL — ABNORMAL LOW (ref 12.0–15.0)
MCH: 28.1 pg (ref 26.0–34.0)
MCHC: 32.8 g/dL (ref 30.0–36.0)
MCV: 85.6 fL (ref 80.0–100.0)
Platelets: 325 10*3/uL (ref 150–400)
RBC: 4.02 MIL/uL (ref 3.87–5.11)
RDW: 13.6 % (ref 11.5–15.5)
WBC: 10.8 10*3/uL — ABNORMAL HIGH (ref 4.0–10.5)
nRBC: 0 % (ref 0.0–0.2)

## 2023-03-30 LAB — GLUCOSE, CAPILLARY: Glucose-Capillary: 103 mg/dL — ABNORMAL HIGH (ref 70–99)

## 2023-03-30 LAB — LIPASE, BLOOD: Lipase: 31 U/L (ref 11–51)

## 2023-03-30 SURGERY — APPENDECTOMY, LAPAROSCOPIC
Anesthesia: General | Site: Abdomen

## 2023-03-30 MED ORDER — HYDROMORPHONE HCL 2 MG/ML IJ SOLN
INTRAMUSCULAR | Status: AC
  Filled 2023-03-30: qty 1

## 2023-03-30 MED ORDER — PIPERACILLIN-TAZOBACTAM 3.375 G IVPB
3.3750 g | Freq: Three times a day (TID) | INTRAVENOUS | Status: DC
  Administered 2023-03-30 – 2023-04-01 (×5): 3.375 g via INTRAVENOUS
  Filled 2023-03-30 (×5): qty 50

## 2023-03-30 MED ORDER — MIDAZOLAM HCL 5 MG/5ML IJ SOLN
INTRAMUSCULAR | Status: DC | PRN
  Administered 2023-03-30: 2 mg via INTRAVENOUS

## 2023-03-30 MED ORDER — MIDAZOLAM HCL 2 MG/2ML IJ SOLN
INTRAMUSCULAR | Status: AC
  Filled 2023-03-30: qty 2

## 2023-03-30 MED ORDER — ENOXAPARIN SODIUM 30 MG/0.3ML IJ SOSY: 30.0000 mg | PREFILLED_SYRINGE | INTRAMUSCULAR | Status: DC

## 2023-03-30 MED ORDER — FENTANYL CITRATE (PF) 100 MCG/2ML IJ SOLN
INTRAMUSCULAR | Status: DC | PRN
  Administered 2023-03-30: 50 ug via INTRAVENOUS
  Administered 2023-03-30: 25 ug via INTRAVENOUS
  Administered 2023-03-30: 50 ug via INTRAVENOUS
  Administered 2023-03-30: 100 ug via INTRAVENOUS
  Administered 2023-03-30: 50 ug via INTRAVENOUS
  Administered 2023-03-30: 25 ug via INTRAVENOUS

## 2023-03-30 MED ORDER — PIPERACILLIN-TAZOBACTAM 3.375 G IVPB: 3.3750 g | Freq: Three times a day (TID) | INTRAVENOUS | Status: DC

## 2023-03-30 MED ORDER — AMISULPRIDE (ANTIEMETIC) 5 MG/2ML IV SOLN: 10.0000 mg | Freq: Once | INTRAVENOUS | Status: DC | PRN

## 2023-03-30 MED ORDER — PRAVASTATIN SODIUM 20 MG PO TABS: 20.0000 mg | ORAL_TABLET | Freq: Every evening | ORAL | Status: DC

## 2023-03-30 MED ORDER — ONDANSETRON HCL 4 MG/2ML IJ SOLN: 4.0000 mg | Freq: Four times a day (QID) | INTRAMUSCULAR | Status: DC | PRN

## 2023-03-30 MED ORDER — PROPOFOL 10 MG/ML IV BOLUS
INTRAVENOUS | Status: AC
  Filled 2023-03-30: qty 20

## 2023-03-30 MED ORDER — HYDROMORPHONE HCL 1 MG/ML IJ SOLN
0.5000 mg | INTRAMUSCULAR | Status: DC | PRN
  Administered 2023-03-30 (×2): 0.5 mg via INTRAVENOUS

## 2023-03-30 MED ORDER — ACETAMINOPHEN 10 MG/ML IV SOLN
INTRAVENOUS | Status: DC | PRN
  Administered 2023-03-30: 1000 mg via INTRAVENOUS

## 2023-03-30 MED ORDER — MORPHINE SULFATE (PF) 2 MG/ML IV SOLN: 1.0000 mg | INTRAVENOUS | Status: DC | PRN

## 2023-03-30 MED ORDER — SUCCINYLCHOLINE CHLORIDE 200 MG/10ML IV SOSY
PREFILLED_SYRINGE | INTRAVENOUS | Status: DC | PRN
  Administered 2023-03-30: 140 mg via INTRAVENOUS

## 2023-03-30 MED ORDER — FENTANYL CITRATE (PF) 100 MCG/2ML IJ SOLN
INTRAMUSCULAR | Status: AC
  Filled 2023-03-30: qty 2

## 2023-03-30 MED ORDER — ROCURONIUM BROMIDE 10 MG/ML (PF) SYRINGE
PREFILLED_SYRINGE | INTRAVENOUS | Status: DC | PRN
  Administered 2023-03-30: 30 mg via INTRAVENOUS
  Administered 2023-03-30: 60 mg via INTRAVENOUS

## 2023-03-30 MED ORDER — SCOPOLAMINE 1 MG/3DAYS TD PT72
MEDICATED_PATCH | TRANSDERMAL | Status: AC
  Filled 2023-03-30: qty 1

## 2023-03-30 MED ORDER — ROCURONIUM BROMIDE 10 MG/ML (PF) SYRINGE
PREFILLED_SYRINGE | INTRAVENOUS | Status: AC
  Filled 2023-03-30: qty 10

## 2023-03-30 MED ORDER — BUPIVACAINE-EPINEPHRINE 0.25% -1:200000 IJ SOLN
INTRAMUSCULAR | Status: DC | PRN
  Administered 2023-03-30: 25 mL

## 2023-03-30 MED ORDER — BUPIVACAINE-EPINEPHRINE 0.25% -1:200000 IJ SOLN
INTRAMUSCULAR | Status: AC
  Filled 2023-03-30: qty 1

## 2023-03-30 MED ORDER — HYDROMORPHONE HCL 1 MG/ML IJ SOLN
INTRAMUSCULAR | Status: DC | PRN
  Administered 2023-03-30: 1 mg via INTRAVENOUS

## 2023-03-30 MED ORDER — DEXTROSE-SODIUM CHLORIDE 5-0.9 % IV SOLN: INTRAVENOUS | Status: AC

## 2023-03-30 MED ORDER — ONDANSETRON HCL 4 MG/2ML IJ SOLN
INTRAMUSCULAR | Status: DC | PRN
  Administered 2023-03-30: 4 mg via INTRAVENOUS

## 2023-03-30 MED ORDER — KETOROLAC TROMETHAMINE 30 MG/ML IJ SOLN
INTRAMUSCULAR | Status: AC
  Filled 2023-03-30: qty 1

## 2023-03-30 MED ORDER — ENOXAPARIN SODIUM 30 MG/0.3ML IJ SOSY
30.0000 mg | PREFILLED_SYRINGE | INTRAMUSCULAR | Status: DC
  Administered 2023-03-31 – 2023-04-01 (×2): 30 mg via SUBCUTANEOUS
  Filled 2023-03-30 (×2): qty 0.3

## 2023-03-30 MED ORDER — POTASSIUM CHLORIDE CRYS ER 20 MEQ PO TBCR
40.0000 meq | EXTENDED_RELEASE_TABLET | Freq: Once | ORAL | Status: AC
  Administered 2023-03-30: 40 meq via ORAL
  Filled 2023-03-30: qty 2

## 2023-03-30 MED ORDER — ACETAMINOPHEN 10 MG/ML IV SOLN
INTRAVENOUS | Status: AC
  Filled 2023-03-30: qty 100

## 2023-03-30 MED ORDER — ONDANSETRON HCL 4 MG/2ML IJ SOLN: 4.0000 mg | Freq: Once | INTRAMUSCULAR | Status: DC | PRN

## 2023-03-30 MED ORDER — DEXAMETHASONE SODIUM PHOSPHATE 10 MG/ML IJ SOLN
INTRAMUSCULAR | Status: DC | PRN
  Administered 2023-03-30: 10 mg via INTRAVENOUS

## 2023-03-30 MED ORDER — PROPOFOL 10 MG/ML IV BOLUS
INTRAVENOUS | Status: DC | PRN
  Administered 2023-03-30: 180 mg via INTRAVENOUS

## 2023-03-30 MED ORDER — SODIUM CHLORIDE 0.9 % IV BOLUS
1000.0000 mL | Freq: Once | INTRAVENOUS | Status: AC
  Administered 2023-03-30: 1000 mL via INTRAVENOUS

## 2023-03-30 MED ORDER — LIDOCAINE HCL (CARDIAC) PF 100 MG/5ML IV SOSY
PREFILLED_SYRINGE | INTRAVENOUS | Status: DC | PRN
  Administered 2023-03-30: 100 mg via INTRATRACHEAL

## 2023-03-30 MED ORDER — ONDANSETRON HCL 4 MG/2ML IJ SOLN
INTRAMUSCULAR | Status: AC
  Filled 2023-03-30: qty 2

## 2023-03-30 MED ORDER — HYDROMORPHONE HCL 1 MG/ML IJ SOLN
INTRAMUSCULAR | Status: AC
  Filled 2023-03-30: qty 2

## 2023-03-30 MED ORDER — SCOPOLAMINE 1 MG/3DAYS TD PT72
1.0000 | MEDICATED_PATCH | Freq: Once | TRANSDERMAL | Status: DC
  Administered 2023-03-30: 1.5 mg via TRANSDERMAL

## 2023-03-30 MED ORDER — SUCCINYLCHOLINE CHLORIDE 200 MG/10ML IV SOSY
PREFILLED_SYRINGE | INTRAVENOUS | Status: AC
  Filled 2023-03-30: qty 10

## 2023-03-30 MED ORDER — SODIUM CHLORIDE 0.9 % IR SOLN
Status: DC | PRN
  Administered 2023-03-30: 1000 mL

## 2023-03-30 MED ORDER — LACTATED RINGERS IV SOLN: INTRAVENOUS | Status: DC | PRN

## 2023-03-30 MED ORDER — CARMEX CLASSIC LIP BALM EX OINT
TOPICAL_OINTMENT | CUTANEOUS | Status: AC
  Filled 2023-03-30: qty 10

## 2023-03-30 MED ORDER — METOCLOPRAMIDE HCL 5 MG/ML IJ SOLN
10.0000 mg | Freq: Once | INTRAMUSCULAR | Status: AC
  Administered 2023-03-30: 10 mg via INTRAVENOUS
  Filled 2023-03-30: qty 2

## 2023-03-30 MED ORDER — PANTOPRAZOLE SODIUM 40 MG IV SOLR: 40.0000 mg | Freq: Every day | INTRAVENOUS | Status: DC

## 2023-03-30 MED ORDER — FENTANYL CITRATE PF 50 MCG/ML IJ SOSY: 25.0000 ug | PREFILLED_SYRINGE | INTRAMUSCULAR | Status: DC | PRN

## 2023-03-30 MED ORDER — PRAVASTATIN SODIUM 20 MG PO TABS
40.0000 mg | ORAL_TABLET | Freq: Every evening | ORAL | Status: DC
  Administered 2023-03-31: 40 mg via ORAL
  Filled 2023-03-30: qty 2

## 2023-03-30 MED ORDER — PANTOPRAZOLE SODIUM 40 MG IV SOLR
40.0000 mg | Freq: Every day | INTRAVENOUS | Status: DC
  Administered 2023-03-30 – 2023-03-31 (×2): 40 mg via INTRAVENOUS
  Filled 2023-03-30 (×2): qty 10

## 2023-03-30 MED ORDER — OXYCODONE HCL 5 MG PO TABS
5.0000 mg | ORAL_TABLET | ORAL | Status: DC | PRN
Start: 2023-03-30 — End: 2023-04-01
  Administered 2023-03-30: 5 mg via ORAL
  Administered 2023-03-31: 10 mg via ORAL
  Filled 2023-03-30: qty 1
  Filled 2023-03-30: qty 2

## 2023-03-30 MED ORDER — DEXAMETHASONE SODIUM PHOSPHATE 10 MG/ML IJ SOLN
INTRAMUSCULAR | Status: AC
  Filled 2023-03-30: qty 1

## 2023-03-30 MED ORDER — ONDANSETRON 4 MG PO TBDP
4.0000 mg | ORAL_TABLET | Freq: Four times a day (QID) | ORAL | Status: DC | PRN
  Administered 2023-04-01: 4 mg via ORAL
  Filled 2023-03-30: qty 1

## 2023-03-30 MED ORDER — ONDANSETRON 4 MG PO TBDP: 4.0000 mg | ORAL_TABLET | Freq: Four times a day (QID) | ORAL | Status: DC | PRN

## 2023-03-30 MED ORDER — ONDANSETRON HCL 4 MG/2ML IJ SOLN
4.0000 mg | Freq: Once | INTRAMUSCULAR | Status: AC | PRN
  Administered 2023-03-30: 4 mg via INTRAVENOUS
  Filled 2023-03-30: qty 2

## 2023-03-30 MED ORDER — FENTANYL CITRATE PF 50 MCG/ML IJ SOSY
PREFILLED_SYRINGE | INTRAMUSCULAR | Status: AC
  Filled 2023-03-30: qty 3

## 2023-03-30 MED ORDER — PIPERACILLIN-TAZOBACTAM 3.375 G IVPB 30 MIN
3.3750 g | Freq: Once | INTRAVENOUS | Status: AC
  Administered 2023-03-30: 3.375 g via INTRAVENOUS
  Filled 2023-03-30: qty 50

## 2023-03-30 MED ORDER — IOHEXOL 300 MG/ML  SOLN
100.0000 mL | Freq: Once | INTRAMUSCULAR | Status: AC | PRN
  Administered 2023-03-30: 100 mL via INTRAVENOUS

## 2023-03-30 MED ORDER — MORPHINE SULFATE (PF) 4 MG/ML IV SOLN: 4.0000 mg | Freq: Once | INTRAVENOUS | Status: DC

## 2023-03-30 MED ORDER — SODIUM CHLORIDE 0.9% FLUSH
10.0000 mL | Freq: Two times a day (BID) | INTRAVENOUS | Status: DC
  Administered 2023-03-30 – 2023-03-31 (×3): 10 mL via INTRAVENOUS

## 2023-03-30 MED ORDER — SUGAMMADEX SODIUM 200 MG/2ML IV SOLN
INTRAVENOUS | Status: DC | PRN
  Administered 2023-03-30: 300 mg via INTRAVENOUS

## 2023-03-30 MED ORDER — LIDOCAINE HCL (PF) 2 % IJ SOLN
INTRAMUSCULAR | Status: AC
  Filled 2023-03-30: qty 5

## 2023-03-30 SURGICAL SUPPLY — 43 items
ADH SKN CLS APL DERMABOND .7 (GAUZE/BANDAGES/DRESSINGS) ×1
APL PRP STRL LF DISP 70% ISPRP (MISCELLANEOUS) ×1
APPLIER CLIP ROT 10 11.4 M/L (STAPLE)
APR CLP MED LRG 11.4X10 (STAPLE)
BAG COUNTER SPONGE SURGICOUNT (BAG) IMPLANT
BAG SPNG CNTER NS LX DISP (BAG)
CABLE HIGH FREQUENCY MONO STRZ (ELECTRODE) ×1 IMPLANT
CHLORAPREP W/TINT 26 (MISCELLANEOUS) ×1 IMPLANT
CLIP APPLIE ROT 10 11.4 M/L (STAPLE) IMPLANT
CUTTER FLEX LINEAR 45M (STAPLE) IMPLANT
DERMABOND ADVANCED .7 DNX12 (GAUZE/BANDAGES/DRESSINGS) ×1 IMPLANT
DRAIN CHANNEL 19F RND (DRAIN) IMPLANT
ELECT REM PT RETURN 15FT ADLT (MISCELLANEOUS) ×1 IMPLANT
ENDOLOOP SUT PDS II 0 18 (SUTURE) IMPLANT
EVACUATOR SILICONE 100CC (DRAIN) IMPLANT
GAUZE SPONGE 2X2 8PLY STRL LF (GAUZE/BANDAGES/DRESSINGS) IMPLANT
GLOVE BIO SURGEON STRL SZ7.5 (GLOVE) ×1 IMPLANT
GOWN STRL REUS W/ TWL LRG LVL3 (GOWN DISPOSABLE) IMPLANT
GOWN STRL REUS W/TWL LRG LVL3 (GOWN DISPOSABLE) ×1
IRRIG SUCT STRYKERFLOW 2 WTIP (MISCELLANEOUS) ×1
IRRIGATION SUCT STRKRFLW 2 WTP (MISCELLANEOUS) ×1 IMPLANT
KIT BASIN OR (CUSTOM PROCEDURE TRAY) ×1 IMPLANT
KIT TURNOVER KIT A (KITS) IMPLANT
PENCIL SMOKE EVACUATOR (MISCELLANEOUS) IMPLANT
RELOAD 45 THICK GREEN (ENDOMECHANICALS) ×1 IMPLANT
RELOAD STAPLE 45 3.5 BLU ETS (ENDOMECHANICALS) IMPLANT
RELOAD STAPLE 45 GRN THCK ETS (ENDOMECHANICALS) IMPLANT
RELOAD STAPLE TA45 3.5 REG BLU (ENDOMECHANICALS) IMPLANT
SCISSORS LAP 5X35 DISP (ENDOMECHANICALS) ×1 IMPLANT
SET TUBE SMOKE EVAC HIGH FLOW (TUBING) ×1 IMPLANT
SHEARS HARMONIC 36 ACE (MISCELLANEOUS) ×1 IMPLANT
SLEEVE Z-THREAD 5X100MM (TROCAR) IMPLANT
SPIKE FLUID TRANSFER (MISCELLANEOUS) ×1 IMPLANT
SUT ETHILON 2 0 PS N (SUTURE) IMPLANT
SUT MNCRL AB 4-0 PS2 18 (SUTURE) ×1 IMPLANT
SYS BAG RETRIEVAL 10MM (BASKET) ×1
SYSTEM BAG RETRIEVAL 10MM (BASKET) ×1 IMPLANT
TOWEL OR 17X26 10 PK STRL BLUE (TOWEL DISPOSABLE) ×1 IMPLANT
TRAY FOLEY MTR SLVR 14FR STAT (SET/KITS/TRAYS/PACK) IMPLANT
TRAY FOLEY MTR SLVR 16FR STAT (SET/KITS/TRAYS/PACK) IMPLANT
TRAY LAPAROSCOPIC (CUSTOM PROCEDURE TRAY) ×1 IMPLANT
TROCAR BALLN 12MMX100 BLUNT (TROCAR) ×1 IMPLANT
TROCAR Z-THREAD OPTICAL 5X100M (TROCAR) ×1 IMPLANT

## 2023-03-30 NOTE — Plan of Care (Signed)

## 2023-03-30 NOTE — H&P (Signed)
Amy [name] is an 52 y.o. female.   Chief Complaint: Abdominal pain HPI: The patient is a 52 year old black female who began having abdominal pain last week while she was on a cruise.  She thought it was because of the food she ate.  Over the weekend her pain has progressed.  She denies any fevers or chills.  She has had nausea and vomiting as well as diarrhea associated with her pain.  She came to the emergency department where a CT scan was consistent with acute appendicitis with some evidence of perforation and early developing abscess.  She does have prehypertension and prediabetes.  Past Medical History:  Diagnosis Date   Anemia    Diabetes mellitus without complication (HCC)    PRE-DIABETIC, NO LONGER NEED MEDICATION   Essential hypertension 09/28/2015   Hypertension    Leg swelling    PONV (postoperative nausea and vomiting)    Pre-syncope 09/28/2015   PVC (premature ventricular contraction) 09/28/2015   Vitamin D deficiency     Past Surgical History:  Procedure Laterality Date   ABDOMINAL HYSTERECTOMY Bilateral 03/27/2015   Procedure: HYSTERECTOMY ABDOMINAL WITH BILATERAL SALPINGECTOMY;  Surgeon: Richardean Chimera, MD;  Location: [facility];  Service: Gynecology;  Laterality: Bilateral;   CESAREAN SECTION     COLONOSCOPY WITH PROPOFOL N/A 12/16/2019   Procedure: COLONOSCOPY WITH PROPOFOL;  Surgeon: Kerin Salen, MD;  Location: [facility];  Service: Gastroenterology;  Laterality: N/A;   KNEE ARTHROSCOPY WITH MEDIAL MENISECTOMY Right 03/12/2022   Procedure: KNEE ARTHROSCOPY WITH MEDIAL MENISECTOMY;  Surgeon: Vickki Hearing, MD;  Location: [facility];  Service: Orthopedics;  Laterality: Right;   LAPAROSCOPIC VAGINAL HYSTERECTOMY WITH SALPINGECTOMY Bilateral 03/27/2015   Procedure: ATTEMPTED LAPAROSCOPIC ASSISTED VAGINAL HYSTERECTOMY WITH BILATERAL SALPINGECTOMY;  Surgeon: Richardean Chimera, MD;  Location: [facility];  Service: Gynecology;  Laterality: Bilateral;   TUBAL LIGATION     WISDOM TOOTH  EXTRACTION      Family History  Problem Relation Age of Onset   Hypertension Mother    Diabetes Mother    Dementia Father    Diabetes Sister    Hypertension Sister    Hypertension Maternal Grandmother    Arthritis Maternal Grandmother    Diabetes Paternal Grandmother    Stroke Paternal Grandmother    Hypertension Sister    Social History:  reports that she has never smoked. She has never used smokeless tobacco. She reports current alcohol use. She reports that she does not use drugs.  Allergies:  Allergies  Allergen Reactions   Lisinopril Cough    (Not in a hospital admission)   Results for orders placed or performed during the hospital encounter of 03/30/23 (from the past 48 hour(s))  Lipase, blood     Status: None   Collection Time: 03/30/23 11:19 AM  Result Value Ref Range   Lipase 31 11 - 51 U/L    Comment: Performed at [facility], [address], [place], [place] [address]  Comprehensive metabolic panel     Status: Abnormal   Collection Time: 03/30/23 11:19 AM  Result Value Ref Range   Sodium 133 (L) 135 - 145 mmol/L   Potassium 3.3 (L) 3.5 - 5.1 mmol/L   Chloride 99 98 - 111 mmol/L   CO2 24 22 - 32 mmol/L   Glucose, Bld 131 (H) 70 - 99 mg/dL    Comment: Glucose reference range applies only to samples taken after fasting for at least 8 hours.   BUN 8 6 - 20 mg/dL  Creatinine, Ser 0.52 0.44 - 1.00 mg/dL   Calcium 8.5 (L) 8.9 - 10.3 mg/dL   Total Protein 8.7 (H) 6.5 - 8.1 g/dL   Albumin 2.9 (L) 3.5 - 5.0 g/dL   AST 29 15 - 41 U/L   ALT 30 0 - 44 U/L   Alkaline Phosphatase 54 38 - 126 U/L   Total Bilirubin 0.8 <1.2 mg/dL   GFR, Estimated >96 >04 mL/min    Comment: (NOTE) Calculated using the CKD-EPI Creatinine Equation (2021)    Anion gap 10 5 - 15    Comment: Performed at [facility], [address], [place], [place] [address]  CBC     Status: Abnormal   Collection Time: 03/30/23 11:19 AM  Result Value Ref Range    WBC 10.8 (H) 4.0 - 10.5 K/uL   RBC 4.02 3.87 - 5.11 MIL/uL   Hemoglobin 11.3 (L) 12.0 - 15.0 g/dL   HCT 11.9 (L) 14.7 - 82.9 %   MCV 85.6 80.0 - 100.0 fL   MCH 28.1 26.0 - 34.0 pg   MCHC 32.8 30.0 - 36.0 g/dL   RDW 56.2 13.0 - 86.5 %   Platelets 325 150 - 400 K/uL   nRBC 0.0 0.0 - 0.2 %    Comment: Performed at [facility], [address] [place], [place] [address]  Urinalysis, Routine w reflex microscopic -Urine, Clean Catch     Status: Abnormal   Collection Time: 03/30/23 11:40 AM  Result Value Ref Range   Color, Urine AMBER (A) YELLOW    Comment: BIOCHEMICALS MAY BE AFFECTED BY COLOR   APPearance CLOUDY (A) CLEAR   Specific Gravity, Urine 1.018 1.005 - 1.030   pH 6.0 5.0 - 8.0   Glucose, UA NEGATIVE NEGATIVE mg/dL   Hgb urine dipstick MODERATE (A) NEGATIVE   Bilirubin Urine NEGATIVE NEGATIVE   Ketones, ur NEGATIVE NEGATIVE mg/dL   Protein, ur 629 (A) NEGATIVE mg/dL   Nitrite NEGATIVE NEGATIVE   Leukocytes,Ua MODERATE (A) NEGATIVE   RBC / HPF 6-10 0 - 5 RBC/hpf   WBC, UA >50 0 - 5 WBC/hpf   Bacteria, UA MANY (A) NONE SEEN   Squamous Epithelial / HPF 11-20 0 - 5 /HPF   Mucus PRESENT     Comment: Performed at [facility], [address], [place], [place] [address]   CT ABDOMEN PELVIS W CONTRAST  Result Date: 03/30/2023 CLINICAL DATA:  Acute right lower quadrant abdominal pain. EXAM: CT ABDOMEN AND PELVIS WITH CONTRAST TECHNIQUE: Multidetector CT imaging of the abdomen and pelvis was performed using the standard protocol following bolus administration of intravenous contrast. RADIATION DOSE REDUCTION: This exam was performed according to the departmental dose-optimization program which includes automated exposure control, adjustment of the mA and/or kV according to patient size and/or use of iterative reconstruction technique. CONTRAST:  OMNIPAQUE IOHEXOL 300 MG/ML  SOLN COMPARISON:  June 29, 2016. FINDINGS: Lower chest: No  acute abnormality. Hepatobiliary: No focal liver abnormality is seen. No gallstones, gallbladder wall thickening, or biliary dilatation. Pancreas: Unremarkable. No pancreatic ductal dilatation or surrounding inflammatory changes. Spleen: Normal in size without focal abnormality. Adrenals/Urinary Tract: Adrenal glands are unremarkable. Kidneys are normal, without renal calculi, focal lesion, or hydronephrosis. Bladder is unremarkable. Stomach/Bowel: The stomach is unremarkable. There is no evidence of bowel obstruction. The appendix is enlarged and inflamed, consistent with appendicitis. It has a maximum measured thickness of 9 mm. There is also noted 4.4 x 4.2 cm air and  gas collection adjacent to the distal portion of the appendix concerning for periappendiceal abscess and perforation. There is also noted wall thickening of the cecum most likely representing inflammation secondary to the appendicitis. Vascular/Lymphatic: No significant vascular findings are present. No enlarged abdominal or pelvic lymph nodes. Reproductive: Status post hysterectomy. No adnexal masses. Other: No hernia is noted. Musculoskeletal: No acute or significant osseous findings. IMPRESSION: Findings consistent with acute appendicitis. 4.4 x 4.2 cm probable periappendiceal abscess is noted consistent with perforation. Wall thickening of the adjacent cecum is noted most likely representing inflammation secondary to appendicitis. Critical Value/emergent results were called by telephone at the time of interpretation on 03/30/2023 at 1:29 pm to provider [facility] , who verbally acknowledged these results. Electronically Signed   By: Lupita Raider M.D.   On: 03/30/2023 13:30    Review of Systems  Constitutional: Negative.   HENT: Negative.    Eyes: Negative.   Respiratory: Negative.    Cardiovascular: Negative.   Gastrointestinal:  Positive for abdominal pain, diarrhea, nausea and vomiting.  Endocrine: Negative.   Genitourinary:  Negative.   Musculoskeletal: Negative.   Skin: Negative.   Allergic/Immunologic: Negative.   Neurological: Negative.   Hematological: Negative.   Psychiatric/Behavioral: Negative.      Blood pressure (!) 157/75, pulse 97, temperature 99.5 F (37.5 C), temperature source Oral, resp. rate (!) 22, height 5' 8.5" (1.74 m), weight (!) 153.8 kg, last menstrual period 03/06/2015, SpO2 97%. Physical Exam Vitals reviewed.  Constitutional:      General: She is not in acute distress.    Appearance: Normal appearance. She is obese.  HENT:     Head: Normocephalic and atraumatic.     Right Ear: External ear normal.     Left Ear: External ear normal.     Nose: Nose normal.     Mouth/Throat:     Mouth: Mucous membranes are moist.     Pharynx: Oropharynx is clear.  Eyes:     General: No scleral icterus.    Extraocular Movements: Extraocular movements intact.     Conjunctiva/sclera: Conjunctivae normal.     Pupils: Pupils are equal, round, and reactive to light.  Cardiovascular:     Rate and Rhythm: Normal rate and regular rhythm.     Pulses: Normal pulses.     Heart sounds: Normal heart sounds.  Pulmonary:     Effort: Pulmonary effort is normal. No respiratory distress.     Breath sounds: Normal breath sounds.  Abdominal:     General: Abdomen is flat.     Palpations: Abdomen is soft.     Comments: There is moderate tenderness to palpation in the right lower quadrant.  There is no guarding or peritonitis  Musculoskeletal:        General: No swelling or deformity. Normal range of motion.     Cervical back: Normal range of motion and neck supple.  Skin:    General: Skin is warm and dry.     Coloration: Skin is not jaundiced.  Neurological:     General: No focal deficit present.     Mental Status: She is alert and oriented to person, place, and time.  Psychiatric:        Mood and Affect: Mood normal.        Behavior: Behavior normal.      Assessment/Plan The patient appears to have  acute appendicitis with some evidence of early perforation.  Because of the risk of sepsis I feel she would likely  benefit from having her appendix removed.  She would also like to have this done.  I have discussed with her in detail the risks and benefits of the operation as well as some of the technical aspects including the risk of leak and she understands and wishes to proceed.  We will start her on broad-spectrum antibiotic therapy and plan for surgery this afternoon  Chevis Pretty III, MD 03/30/2023, 2:42 PM

## 2023-03-30 NOTE — ED Provider Notes (Signed)
[place] EMERGENCY DEPARTMENT AT [facility] Provider Note   CSN: [id number] Arrival date & time: 03/30/23  1032     History  Chief Complaint  Patient presents with   Emesis   Diarrhea    Amy [name] is a 52 y.o. female with past medical history of hypertension, diabetes denting to emergency room with 2 weeks of nausea vomiting diarrhea and intermittent right lower quadrant abdominal pain.  Patient reports this started when she was on her cruise, patient reports she got back from a cruise 1 week ago noticed worsening of her symptoms around this time.  Patient is not been tolerating normal intake.  Reports decreased appetite.  Patient reports last week she is on been able to tolerate liquid diet.  Patient feels very dehydrated and reports she feels very ill.  Denies fever, chills, chest pain, shortness of breath.  Patient was given nausea medication by primary care reports she is out of this medication.   Emesis Associated symptoms: diarrhea   Diarrhea Associated symptoms: vomiting        Home Medications Prior to Admission medications   Medication Sig Start Date End Date Taking? Authorizing Provider  acetaminophen (TYLENOL) 500 MG tablet Take 1,000 mg by mouth every 6 (six) hours as needed for moderate pain or headache.    [provider]  Ascorbic Acid (VITAMIN C) 1000 MG tablet Take 1,000 mg by mouth daily.    [provider]  Cholecalciferol (DIALYVITE VITAMIN D 5000) 125 MCG (5000 UT) capsule Take 5,000 Units by mouth daily.    [provider]  fexofenadine-pseudoephedrine (ALLEGRA-D 24) 180-240 MG 24 hr tablet Take 1 tablet by mouth daily as needed (allergies).    [provider]  hydrochlorothiazide (MICROZIDE) 12.5 MG capsule Take 1 capsule (12.5 mg total) by mouth daily as needed (FOR SWELLING OR ELEVATED BLOOD PRESSURE). Patient taking differently: Take 12.5 mg by mouth daily as needed (swelling). 10/19/15   Chilton Si, MD  Multiple Vitamin (MULTIVITAMIN WITH MINERALS) TABS tablet Take 1 tablet by mouth daily after breakfast.    [provider]  pravastatin (PRAVACHOL) 10 MG tablet Take 20 mg by mouth every evening.    [provider]  promethazine (PHENERGAN) 12.5 MG tablet Take 1 tablet (12.5 mg total) by mouth every 6 (six) hours as needed for nausea or vomiting. 03/13/22   Oliver Barre, MD      Allergies    Lisinopril    Review of Systems   Review of Systems  Gastrointestinal:  Positive for diarrhea and vomiting.    Physical Exam Updated Vital Signs BP (!) 143/97   Pulse 97   Temp 97.9 F (36.6 C) (Oral)   Resp 18   Ht 5' 8.5" (1.74 m)   Wt (!) 153.8 kg   LMP 03/06/2015 (Exact Date)   SpO2 98%   BMI 50.80 kg/m  Physical Exam Vitals and nursing note reviewed.  Constitutional:      General: She is not in acute distress.    Appearance: She is ill-appearing. She is not toxic-appearing or diaphoretic.  HENT:     Head: Normocephalic and atraumatic.  Eyes:     General: No scleral icterus.    Conjunctiva/sclera: Conjunctivae normal.  Cardiovascular:     Rate and Rhythm: Normal rate and regular rhythm.     Pulses: Normal pulses.     Heart sounds: Normal heart sounds.  Pulmonary:     Effort: Pulmonary effort is normal. No respiratory  distress.     Breath sounds: Normal breath sounds.  Abdominal:     General: Abdomen is flat. Bowel sounds are normal. There is no distension.     Palpations: Abdomen is soft. There is no mass.     Tenderness: There is abdominal tenderness.     Comments: Right lower quadrant mild tender to palpation  Musculoskeletal:     Right lower leg: No edema.     Left lower leg: No edema.  Skin:    General: Skin is warm and dry.     Findings: No lesion.  Neurological:     General: No focal deficit present.     Mental Status: She is alert and oriented to person, place, and time. Mental status is at baseline.     ED Results /  Procedures / Treatments   Labs (all labs ordered are listed, but only abnormal results are displayed) Labs Reviewed - No data to display  EKG None  Radiology No results found.  Procedures Procedures    Medications Ordered in ED Medications - No data to display  ED Course/ Medical Decision Making/ A&P                                 Medical Decision Making Amount and/or Complexity of Data Reviewed Labs: ordered. Radiology: ordered.  Risk Prescription drug management. Decision regarding hospitalization.   Amy [name] 52 y.o. presented today for abd pain. Working DDx includes, but not limited to, gastroenteritis, colitis, SBO, appendicitis, cholecystitis, hepatobiliary pathology, gastritis, PUD, ACS, dissection, pancreatitis, nephrolithiasis, AAA, UTI, pyelonephritis, ruptured ectopic pregnancy, PID, ovarian  R/o DDx: These are considered less likely than current impression due to history of present illness, physical exam, labs/imaging findings.  Review of prior external notes: None   Unique Tests and My Interpretation:  CBC with differential: White count 10.8, hemoglobin 11.3 CMP: Potassium 3.3 p.o. potassium ordered, sodium 133, no elevated AST or ALT and no acute kidney injury. Lipase: 31 UA: Many bacteria, white blood cell although appears to be contaminated with presence of squamous epithelial cells.    Imaging:  CT Abd/Pelvis with contrast: evaluate for structural/surgical etiology of patients' severe abdominal pain.  CT scan shows periappendiceal abscess concerning for perforation, acute appendicitis   Problem List / ED Course / Critical interventions / Medication management  Reporting to emergency room with decreased appetite, nausea vomiting diarrhea and right lower quadrant pain.  Patient's potassium was low and was supplemented p.o.  Patient has elevated white blood cell count of 10.8.  Given patient's strange pattern of symptoms obtained CT which shows  appendicitis as well as.  Finds appendiceal abscess concerning for perforation.  Immediately called findings to general surgery who agreed to see patient. I ordered medication including IV antibiotics, potassium, normal saline, Zofran Reevaluation of the patient after these medicines showed that the patient improved Patients vitals assessed. Upon arrival patient is hemodynamically stable.  I have reviewed the patients home medicines and have made adjustments as needed   Consult: General Surgery who agreed to see patient.  Recommend patient remains n.p.o.  Plan:  Admit with general surgery, IV antibiotics ordered.         Final Clinical Impression(s) / ED Diagnoses Final diagnoses:  Acute appendicitis with perforation, localized peritonitis, and abscess, without gangrene    Rx / DC Orders ED Discharge Orders     None  Smitty Knudsen, PA-C 03/30/23 1548    Virgina Norfolk, DO 03/30/23 2111

## 2023-03-30 NOTE — Op Note (Signed)
03/30/2023  5:04 PM  PATIENT:  Amy [name]  52 y.o. female  PRE-OPERATIVE DIAGNOSIS:  PERFORATED APPENDICITIS  POST-OPERATIVE DIAGNOSIS:  PERFORATED APPENDICITIS  PROCEDURE:  Procedure(s): APPENDECTOMY LAPAROSCOPIC (N/A)  SURGEON:  Surgeons and Role:    * Griselda Miner, MD - Primary  PHYSICIAN ASSISTANT:   ASSISTANTS: none   ANESTHESIA:   local and general  EBL:  20cc   BLOOD ADMINISTERED:none  DRAINS: (1) Blake drain(s) in the right retrocecal space    LOCAL MEDICATIONS USED:  MARCAINE     SPECIMEN:  Source of Specimen:  appendix  DISPOSITION OF SPECIMEN:  PATHOLOGY  COUNTS:  YES  TOURNIQUET:  * No tourniquets in log *  DICTATION: .Dragon Dictation  After informed consent was obtained patient was brought to the operating room placed in the supine position on the operating room table. After adequate induction of general anesthesia the patient's abdomen was prepped with ChloraPrep, allowed to dry, and draped in usual sterile manner. The area below the umbilicus was infiltrated with quarter percent Marcaine. A small incision was made with a 15 blade knife. This incision was carried down through the subcutaneous tissue bluntly with a hemostat and [employer] retractors until the linea alba was identified. The linea alba was incised with a 15 blade knife. Each side was grasped Coker clamps and elevated anteriorly. The preperitoneal space was probed bluntly with a hemostat until the peritoneum was opened and access was gained to the abdominal cavity. A 0 Vicryl purse string stitch was placed in the fascia surrounding the opening. A Hassan cannula was placed through the opening and anchored in place with the previously placed Vicryl purse string stitch. The laparoscope was placed through the [facility] cannula. The abdomen was insufflated with carbon dioxide without difficulty. Next the suprapubic area was infiltrated with quarter percent Marcaine. A small incision was made with a 15  blade knife. A 5 mm port was placed bluntly through this incision into the abdominal cavity. A site was then chosen in the left upper quadrant for placement of a 5 mm port. The area was infiltrated with quarter percent Marcaine. A small stab incision was made with a 15 blade knife. A 5 mm port was placed bluntly through this incision and the abdominal cavity under direct vision. The laparoscope was then moved to the suprapubic port. Using a Glassman grasper and harmonic scalpel the right lower quadrant was inspected. The appendix was readily identified.  The appendix dove into a retrocecal position and the right colon was densely stuck to the retroperitoneum because of an abscess.  I was able to follow the appendix into the retrocecal space.  I was able to open the mesoappendix near where the appendix joined the cecum.  I was able to get a laparoscopic green load stapler across the base of the appendix at this point, clamped, and divide the appendix between staple lines.  I was then able to bluntly take the appendix out of the retrocecal space.  I did encounter a large pocket of pus.  This was aspirated.   A laparoscopic bag was then inserted through the [facility] cannula. The appendix was placed within the bag and the bag was sealed. The abdomen was then irrigated with copious amounts of saline until the effluent was clear. No other abnormalities were noted. The appendix and bag were removed with the [facility] cannula through the infraumbilical port without difficulty.  A 19 French round Blake drain was brought out through the suprapubic port site.  The drain was anchored to the skin with a 3-0 nylon stitch.  The drain was placed to bulb suction.  The fascial defect was closed with the previously placed Vicryl pursestring stitch as well as with another interrupted 0 Vicryl figure-of-eight stitch. The rest of the ports were removed under direct vision and were found to be hemostatic. The gas was allowed to escape. The  skin incisions were closed with interrupted 4-0 Monocryl subcuticular stitches. Dermabond dressings were applied. The patient tolerated the procedure well. At the end of the case all needle sponge and instrument counts were correct. The patient was then awakened and taken to recovery in stable condition.  PLAN OF CARE: Admit to inpatient   PATIENT DISPOSITION:  PACU - hemodynamically stable.   Delay start of Pharmacological VTE agent (>24hrs) due to surgical blood loss or risk of bleeding: no

## 2023-03-30 NOTE — Transfer of Care (Addendum)
Immediate Anesthesia Transfer of Care Note  Patient: Amy [name]  Procedure(s) Performed: APPENDECTOMY LAPAROSCOPIC (Abdomen)  Patient Location: PACU  Anesthesia Type:General  Level of Consciousness: awake and patient cooperative  Airway & Oxygen Therapy: Patient Spontanous Breathing and Patient connected to nasal cannula oxygen  Post-op Assessment: Report given to RN and Post -op Vital signs reviewed and stable  Post vital signs: Reviewed and stable  Last Vitals:  Vitals Value Taken Time  BP 129/87 03/30/23   1721  Temp 36.6 03/30/23   1721  Pulse 91 03/30/23   1730  Resp 19 03/30/23 1725  SpO2 97% 03/30/23   1730  Vitals shown include unfiled device data.  Last Pain:  Vitals:   03/30/23 1517  TempSrc:   PainSc: 0-No pain         Complications: No notable events documented.

## 2023-03-30 NOTE — Anesthesia Procedure Notes (Signed)
Procedure Name: Intubation Date/Time: 03/30/2023 3:40 PM  Performed by: Oletha Cruel, CRNAPre-anesthesia Checklist: Patient identified, Emergency Drugs available, Suction available and Patient being monitored Patient Re-evaluated:Patient Re-evaluated prior to induction Oxygen Delivery Method: Circle system utilized Preoxygenation: Pre-oxygenation with 100% oxygen Induction Type: IV induction Ventilation: Mask ventilation without difficulty Laryngoscope Size: Mac and 4 Grade View: Grade II Tube type: Oral Number of attempts: 1 Airway Equipment and Method: Stylet Placement Confirmation: ETT inserted through vocal cords under direct vision, positive ETCO2, CO2 detector and breath sounds checked- equal and bilateral Secured at: 23 cm Tube secured with: Tape Dental Injury: Teeth and Oropharynx as per pre-operative assessment  Comments: Patient preoxygenated. RSI. Atraumatic intubation. Lips and teeth remain in preoperative condition.

## 2023-03-30 NOTE — ED Triage Notes (Signed)
Patient arrives ambulatory by POV c/o nausea, emesis and diarrhea x 1 week. Also reports having headache and sore stomach. States she was on a cruise when symptoms began.

## 2023-03-30 NOTE — Anesthesia Preprocedure Evaluation (Addendum)
Anesthesia Evaluation  Patient identified by MRN, date of birth, ID band Patient awake    Reviewed: Allergy & Precautions, NPO status , Patient's Chart, lab work & pertinent test results  History of Anesthesia Complications (+) PONV and history of anesthetic complications  Airway Mallampati: II  TM Distance: >3 FB Neck ROM: Full    Dental  (+) Teeth Intact, Dental Advisory Given   Pulmonary neg pulmonary ROS   Pulmonary exam normal breath sounds clear to auscultation       Cardiovascular hypertension, Pt. on medications Normal cardiovascular exam Rhythm:Regular Rate:Normal     Neuro/Psych negative neurological ROS     GI/Hepatic negative GI ROS, Neg liver ROS,,,Appendicitis    Endo/Other  diabetes, Type 2  Morbid obesity (BMI 50.79)  Renal/GU negative Renal ROS     Musculoskeletal negative musculoskeletal ROS (+)    Abdominal   Peds  Hematology  (+) Blood dyscrasia, anemia   Anesthesia Other Findings   Reproductive/Obstetrics                             Anesthesia Physical Anesthesia Plan  ASA: 4  Anesthesia Plan: General   Post-op Pain Management: Ofirmev IV (intra-op)* and Toradol IV (intra-op)*   Induction: Intravenous  PONV Risk Score and Plan: 4 or greater and Scopolamine patch - Pre-op, Midazolam, Dexamethasone and Ondansetron  Airway Management Planned: Oral ETT  Additional Equipment:   Intra-op Plan:   Post-operative Plan: Extubation in OR  Informed Consent: I have reviewed the patients History and Physical, chart, labs and discussed the procedure including the risks, benefits and alternatives for the proposed anesthesia with the patient or authorized representative who has indicated his/her understanding and acceptance.     Dental advisory given  Plan Discussed with: CRNA  Anesthesia Plan Comments:        Anesthesia Quick Evaluation

## 2023-03-31 ENCOUNTER — Encounter (HOSPITAL_COMMUNITY): Payer: Self-pay | Admitting: General Surgery

## 2023-03-31 LAB — BASIC METABOLIC PANEL
Anion gap: 11 (ref 5–15)
BUN: 8 mg/dL (ref 6–20)
CO2: 26 mmol/L (ref 22–32)
Calcium: 8.2 mg/dL — ABNORMAL LOW (ref 8.9–10.3)
Chloride: 101 mmol/L (ref 98–111)
Creatinine, Ser: 0.72 mg/dL (ref 0.44–1.00)
GFR, Estimated: >60 mL/min (ref >60)
Glucose, Bld: 168 mg/dL — ABNORMAL HIGH (ref 70–99)
Potassium: 3.6 mmol/L (ref 3.5–5.1)
Sodium: 138 mmol/L (ref 135–145)

## 2023-03-31 LAB — CBC
HCT: 33 % — ABNORMAL LOW (ref 36.0–46.0)
Hemoglobin: 10.7 g/dL — ABNORMAL LOW (ref 12.0–15.0)
MCH: 28.2 pg (ref 26.0–34.0)
MCHC: 32.4 g/dL (ref 30.0–36.0)
MCV: 87.1 fL (ref 80.0–100.0)
Platelets: 365 10*3/uL (ref 150–400)
RBC: 3.79 MIL/uL — ABNORMAL LOW (ref 3.87–5.11)
RDW: 14.1 % (ref 11.5–15.5)
WBC: 12.8 10*3/uL — ABNORMAL HIGH (ref 4.0–10.5)
nRBC: 0 % (ref 0.0–0.2)

## 2023-03-31 LAB — HIV ANTIBODY (ROUTINE TESTING W REFLEX): HIV Screen 4th Generation wRfx: NONREACTIVE

## 2023-03-31 MED ORDER — ACETAMINOPHEN 500 MG PO TABS
1000.0000 mg | ORAL_TABLET | Freq: Four times a day (QID) | ORAL | Status: DC
  Administered 2023-03-31 – 2023-04-01 (×5): 1000 mg via ORAL
  Filled 2023-03-31 (×5): qty 2

## 2023-03-31 NOTE — Progress Notes (Signed)
Mobility Specialist - Progress Note   03/31/23 1400  Mobility  Activity Ambulated with assistance in hallway  Level of Assistance Standby assist, set-up cues, supervision of patient - no hands on  Assistive Device Front wheel walker  Distance Ambulated (ft) 120 ft  Range of Motion/Exercises Active  Activity Response Tolerated well  Mobility Referral Yes  $Mobility charge 1 Mobility  Mobility Specialist Start Time (ACUTE ONLY) 1353  Mobility Specialist Stop Time (ACUTE ONLY) 1404  Mobility Specialist Time Calculation (min) (ACUTE ONLY) 11 min   Received in chair and agreed to mobility. Had some tenderness in abdomen throughout session. Some difficulty standing and sitting but no hands on.  Returned to chair with all needs met.  Marilynne Halsted Mobility Specialist

## 2023-03-31 NOTE — Progress Notes (Signed)
Progress Note  1 Day Post-Op  Subjective: Patient reports pain  with mobilization and coughing. Tolerating CLD but no bowel function yet. Ambulated last night but not yet today   Objective: Vital signs in last 24 hours: Temp:  [97.6 F (36.4 C)-99.6 F (37.6 C)] 97.9 F (36.6 C) (11/11 0938) Pulse Rate:  [77-92] 77 (11/11 0938) Resp:  [15-22] 18 (11/11 0938) BP: (125-157)/(73-88) 126/88 (11/11 0938) SpO2:  [2 %-99 %] 96 % (11/11 0938) Last BM Date : 03/30/23  Intake/Output from previous day: 11/10 0701 - 11/11 0700 In: 1771.2 [P.O.:120; I.V.:1501.2; IV Piggyback:150] Out: 375 [Urine:100; Drains:150; Blood:25] Intake/Output this shift: Total I/O In: 360 [P.O.:360] Out: 0   PE: General: pleasant, WD, obese female who is laying in bed in NAD Lungs: Respiratory effort nonlabored Abd: soft, appropriately ttp, incisions CDI, drain with SS fluid Psych: A&Ox3 with an appropriate affect.    Lab Results:  Recent Labs    03/30/23 1119  WBC 10.8*  HGB 11.3*  HCT 34.4*  PLT 325   BMET Recent Labs    03/30/23 1119  NA 133*  K 3.3*  CL 99  CO2 24  GLUCOSE 131*  BUN 8  CREATININE 0.52  CALCIUM 8.5*   PT/INR No results for input(s): "LABPROT", "INR" in the last 72 hours. CMP     Component Value Date/Time   NA 133 (L) 03/30/2023 1119   K 3.3 (L) 03/30/2023 1119   CL 99 03/30/2023 1119   CO2 24 03/30/2023 1119   GLUCOSE 131 (H) 03/30/2023 1119   BUN 8 03/30/2023 1119   CREATININE 0.52 03/30/2023 1119   CALCIUM 8.5 (L) 03/30/2023 1119   PROT 8.7 (H) 03/30/2023 1119   ALBUMIN 2.9 (L) 03/30/2023 1119   AST 29 03/30/2023 1119   ALT 30 03/30/2023 1119   ALKPHOS 54 03/30/2023 1119   BILITOT 0.8 03/30/2023 1119   GFRNONAA >60 03/30/2023 1119   GFRAA >60 06/29/2016 0821   Lipase     Component Value Date/Time   LIPASE 31 03/30/2023 1119       Studies/Results: CT ABDOMEN PELVIS W CONTRAST  Result Date: 03/30/2023 CLINICAL DATA:  Acute right lower  quadrant abdominal pain. EXAM: CT ABDOMEN AND PELVIS WITH CONTRAST TECHNIQUE: Multidetector CT imaging of the abdomen and pelvis was performed using the standard protocol following bolus administration of intravenous contrast. RADIATION DOSE REDUCTION: This exam was performed according to the departmental dose-optimization program which includes automated exposure control, adjustment of the mA and/or kV according to patient size and/or use of iterative reconstruction technique. CONTRAST:  OMNIPAQUE IOHEXOL 300 MG/ML  SOLN COMPARISON:  June 29, 2016. FINDINGS: Lower chest: No acute abnormality. Hepatobiliary: No focal liver abnormality is seen. No gallstones, gallbladder wall thickening, or biliary dilatation. Pancreas: Unremarkable. No pancreatic ductal dilatation or surrounding inflammatory changes. Spleen: Normal in size without focal abnormality. Adrenals/Urinary Tract: Adrenal glands are unremarkable. Kidneys are normal, without renal calculi, focal lesion, or hydronephrosis. Bladder is unremarkable. Stomach/Bowel: The stomach is unremarkable. There is no evidence of bowel obstruction. The appendix is enlarged and inflamed, consistent with appendicitis. It has a maximum measured thickness of 9 mm. There is also noted 4.4 x 4.2 cm air and gas collection adjacent to the distal portion of the appendix concerning for periappendiceal abscess and perforation. There is also noted wall thickening of the cecum most likely representing inflammation secondary to the appendicitis. Vascular/Lymphatic: No significant vascular findings are present. No enlarged abdominal or pelvic lymph nodes. Reproductive: Status  post hysterectomy. No adnexal masses. Other: No hernia is noted. Musculoskeletal: No acute or significant osseous findings. IMPRESSION: Findings consistent with acute appendicitis. 4.4 x 4.2 cm probable periappendiceal abscess is noted consistent with perforation. Wall thickening of the adjacent cecum is  noted most likely representing inflammation secondary to appendicitis. Critical Value/emergent results were called by telephone at the time of interpretation on 03/30/2023 at 1:29 pm to provider [facility] , who verbally acknowledged these results. Electronically Signed   By: Lupita Raider M.D.   On: 03/30/2023 13:30    Anti-infectives: Anti-infectives (From admission, onward)    Start     Dose/Rate Route Frequency Ordered Stop   03/30/23 2000  piperacillin-tazobactam (ZOSYN) IVPB 3.375 g        3.375 g 12.5 mL/hr over 240 Minutes Intravenous Every 8 hours 03/30/23 1809 04/04/23 1959   03/30/23 1900  piperacillin-tazobactam (ZOSYN) IVPB 3.375 g  Status:  Discontinued        3.375 g 12.5 mL/hr over 240 Minutes Intravenous Every 8 hours 03/30/23 1809 03/30/23 1818   03/30/23 1345  piperacillin-tazobactam (ZOSYN) IVPB 3.375 g        3.375 g 100 mL/hr over 30 Minutes Intravenous  Once 03/30/23 1330 03/30/23 1415        Assessment/Plan Perforated appendicitis   POD1 s/p laparoscopic appendectomy   - check labs this AM - tolerating CLD, ok to advance to FLD this evening - if having more bowel function could advance to low fiber diet - drain SS with 150 cc out since surgery - continue to monitor  - mobilize  FEN: FLD, likely SLIV when labs back  VTE: LMWH ID: [email or web address]  preHTN preDM Morbid obesity - BMI 50   LOS: 1 day    Juliet Rude, [facility] 03/31/2023, 12:23 PM Please see Amion for pager number during day hours 7:00am-4:30pm

## 2023-04-01 LAB — BASIC METABOLIC PANEL
Anion gap: 10 (ref 5–15)
Anion gap: 12 (ref 5–15)
BUN: 8 mg/dL (ref 6–20)
BUN: 10 mg/dL (ref 6–20)
CO2: 26 mmol/L (ref 22–32)
CO2: 26 mmol/L (ref 22–32)
Calcium: 8 mg/dL — ABNORMAL LOW (ref 8.9–10.3)
Calcium: 8.3 mg/dL — ABNORMAL LOW (ref 8.9–10.3)
Chloride: 101 mmol/L (ref 98–111)
Chloride: 101 mmol/L (ref 98–111)
Creatinine, Ser: 0.72 mg/dL (ref 0.44–1.00)
Creatinine, Ser: 0.55 mg/dL (ref 0.44–1.00)
GFR, Estimated: >60 mL/min (ref >60)
GFR, Estimated: >60 mL/min (ref >60)
Glucose, Bld: 145 mg/dL — ABNORMAL HIGH (ref 70–99)
Glucose, Bld: 109 mg/dL — ABNORMAL HIGH (ref 70–99)
Potassium: 2.7 mmol/L — CL (ref 3.5–5.1)
Potassium: 3.2 mmol/L — ABNORMAL LOW (ref 3.5–5.1)
Sodium: 137 mmol/L (ref 135–145)
Sodium: 139 mmol/L (ref 135–145)

## 2023-04-01 LAB — CBC
HCT: 31.9 % — ABNORMAL LOW (ref 36.0–46.0)
Hemoglobin: 10.1 g/dL — ABNORMAL LOW (ref 12.0–15.0)
MCH: 27.9 pg (ref 26.0–34.0)
MCHC: 31.7 g/dL (ref 30.0–36.0)
MCV: 88.1 fL (ref 80.0–100.0)
Platelets: 332 10*3/uL (ref 150–400)
RBC: 3.62 MIL/uL — ABNORMAL LOW (ref 3.87–5.11)
RDW: 14.1 % (ref 11.5–15.5)
WBC: 9.1 10*3/uL (ref 4.0–10.5)
nRBC: 0 % (ref 0.0–0.2)

## 2023-04-01 MED ORDER — ENOXAPARIN SODIUM 80 MG/0.8ML IJ SOSY: 70.0000 mg | PREFILLED_SYRINGE | INTRAMUSCULAR | Status: DC

## 2023-04-01 MED ORDER — AMOXICILLIN-POT CLAVULANATE 875-125 MG PO TABS: 1.0000 | ORAL_TABLET | Freq: Two times a day (BID) | ORAL | 0 refills | Status: AC

## 2023-04-01 MED ORDER — POTASSIUM CHLORIDE 20 MEQ PO PACK
60.0000 meq | PACK | Freq: Once | ORAL | Status: AC
  Administered 2023-04-01: 60 meq via ORAL
  Filled 2023-04-01: qty 3

## 2023-04-01 MED ORDER — POTASSIUM CHLORIDE CRYS ER 20 MEQ PO TBCR: 20.0000 meq | EXTENDED_RELEASE_TABLET | Freq: Two times a day (BID) | ORAL | 0 refills | Status: AC

## 2023-04-01 MED ORDER — SODIUM CHLORIDE 0.9 % IV SOLN: INTRAVENOUS | Status: DC | PRN

## 2023-04-01 MED ORDER — PANTOPRAZOLE SODIUM 40 MG PO TBEC
40.0000 mg | DELAYED_RELEASE_TABLET | Freq: Every day | ORAL | Status: DC
Start: 2023-04-01 — End: 2023-04-01

## 2023-04-01 MED ORDER — POTASSIUM CHLORIDE 10 MEQ/100ML IV SOLN
10.0000 meq | INTRAVENOUS | Status: AC
  Administered 2023-04-01 (×3): 10 meq via INTRAVENOUS
  Filled 2023-04-01 (×4): qty 100

## 2023-04-01 MED ORDER — ONDANSETRON HCL 8 MG PO TABS: 8.0000 mg | ORAL_TABLET | Freq: Three times a day (TID) | ORAL | 0 refills | Status: AC | PRN

## 2023-04-01 MED ORDER — OXYCODONE HCL 5 MG PO TABS: 5.0000 mg | ORAL_TABLET | Freq: Four times a day (QID) | ORAL | 0 refills | Status: AC | PRN

## 2023-04-01 MED ORDER — AMOXICILLIN-POT CLAVULANATE 875-125 MG PO TABS
1.0000 | ORAL_TABLET | Freq: Two times a day (BID) | ORAL | Status: DC
  Administered 2023-04-01: 1 via ORAL
  Filled 2023-04-01: qty 1

## 2023-04-01 NOTE — Progress Notes (Signed)
Mobility Specialist - Progress Note   04/01/23 1010  Mobility  Activity Ambulated with assistance in hallway  Level of Assistance Standby assist, set-up cues, supervision of patient - no hands on  Assistive Device Front wheel walker  Distance Ambulated (ft) 200 ft  Range of Motion/Exercises Active  Activity Response Tolerated well  Mobility Referral Yes  $Mobility charge 1 Mobility  Mobility Specialist Start Time (ACUTE ONLY) 0955  Mobility Specialist Stop Time (ACUTE ONLY) 1010  Mobility Specialist Time Calculation (min) (ACUTE ONLY) 15 min   Pt was found on bench in room. Agreeable to ambulate. No complaints with session. At EOS returned to bed with all needs met. Call bell in reach. RN notified.  Billey Chang Mobility Specialist

## 2023-04-01 NOTE — Discharge Instructions (Signed)

## 2023-04-01 NOTE — Progress Notes (Signed)
04/01/23 0602  Test: K+ Critical Value: 2.7  Name of Provider Notified: Dwain Sarna MD  Orders Received? Or Actions Taken?: No new orders at this time.

## 2023-04-01 NOTE — Discharge Summary (Signed)
[facility] Discharge Summary   Patient ID: Amy [name] MRN: [id number] DOB/AGE: November 23, 1970 52 y.o.  Admit date: 03/30/2023 Discharge date: 04/01/2023  Admitting Diagnosis: Acute appendicitis   Discharge Diagnosis Perforated appendicitis  S/p laparoscopic appendectomy  Mild hypokalemia  Consultants None   Imaging: No results found.  Procedures Dr. Chevis Pretty (03/30/23) - Laparoscopic Appendectomy  Hospital Course:  Patient is a 52 year old female who presented to the ED with abdominal pain.  Workup showed acute appendicitis.  Patient was admitted and underwent procedure listed above.  Tolerated procedure well and was transferred to the floor.  Diet was advanced as tolerated.  On POD2, the patient was voiding well, tolerating diet, ambulating well, pain well controlled, vital signs stable, incisions c/d/i and felt stable for discharge home.  Patient will follow up in our office in [facility] and knows to call with questions or concerns. She will call to confirm appointment date/time.    Patient had some mild hypokalemia during admission, potassium was replaced and patient discharged on oral supplement x4 doses.   Physical Exam: General:  Alert, NAD, pleasant, comfortable Abd:  Soft, ND, mild tenderness, incisions C/D/I, drain with serosanguinous drainage    I or a member of my team have reviewed this patient in the Controlled Substance Database.   Allergies as of 04/01/2023       Reactions   Lisinopril Cough        Medication List     TAKE these medications    acetaminophen 500 MG tablet Commonly known as: TYLENOL Take 1,000 mg by mouth every 6 (six) hours as needed for moderate pain or headache.   Advil 200 MG tablet Generic drug: ibuprofen Take 200 mg by mouth daily as needed for mild pain (pain score 1-3) or moderate pain (pain score 4-6).   amoxicillin-clavulanate 875-125 MG tablet Commonly known as: AUGMENTIN Take 1 tablet by mouth 2 (two)  times daily for 4 days.   Dialyvite Vitamin D 5000 125 MCG (5000 UT) capsule Generic drug: Cholecalciferol Take 5,000 Units by mouth daily.   fexofenadine-pseudoephedrine 180-240 MG 24 hr tablet Commonly known as: ALLEGRA-D 24 Take 1 tablet by mouth daily as needed (allergies).   hydrochlorothiazide 12.5 MG capsule Commonly known as: MICROZIDE Take 1 capsule (12.5 mg total) by mouth daily as needed (FOR SWELLING OR ELEVATED BLOOD PRESSURE). What changed:  how much to take when to take this   multivitamin with minerals Tabs tablet Take 1 tablet by mouth daily after breakfast.   ondansetron 8 MG tablet Commonly known as: ZOFRAN Take 1 tablet (8 mg total) by mouth every 8 (eight) hours as needed for nausea or vomiting.   oxyCODONE 5 MG immediate release tablet Commonly known as: Oxy IR/ROXICODONE Take 1-2 tablets (5-10 mg total) by mouth every 6 (six) hours as needed for moderate pain (pain score 4-6).   potassium chloride SA 20 MEQ tablet Commonly known as: KLOR-CON M Take 1 tablet (20 mEq total) by mouth 2 (two) times daily for 2 days.   pravastatin 40 MG tablet Commonly known as: PRAVACHOL Take 40 mg by mouth every evening.   scopolamine 1 MG/3DAYS Commonly known as: TRANSDERM-SCOP Place 1 patch onto the skin as needed (when going on cruises).   vitamin C 1000 MG tablet Take 1,000 mg by mouth daily.          Follow-up Information     [facility], [facility]. Go on 04/07/2023.   Specialty: General Surgery Why: RN visit for  drain removal - 3 PM. Please arrive 30 min prior to appointment time to check in. Contact information: [address] [place] [address] [phone]         Griselda Miner, MD. Go on 04/23/2023.   Specialty: General Surgery Why: 3:40 PM, please arrive 20 min prior to appointment time to check in. Contact information: [address] [place] [address] [phone]                  Signed: Juliet Rude , [facility] 04/01/2023, 2:44 PM Please see Amion for pager number during day hours 7:00am-4:30pm

## 2023-04-01 NOTE — Progress Notes (Signed)
Patient was given discharge instructions and all questions were answered. RN provided education about JP drain and instructions to see RN 11/18 and MD follow up. Pt verbalized understanding. Pt stable for discharge. Pt escorted via wheelchair to main entrance for pick up per family member.

## 2023-04-01 NOTE — Progress Notes (Signed)
Progress Note  2 Days Post-Op  Subjective: Having some loose stools this AM, denies nausea although nervous that she may get nauseated. Her son will be at home with her on DC. Hopeful to go home this afternoon if possible.   Objective: Vital signs in last 24 hours: Temp:  [97.8 F (36.6 C)-98.3 F (36.8 C)] 97.8 F (36.6 C) (11/12 0537) Pulse Rate:  [71-80] 71 (11/12 0537) Resp:  [14-18] 17 (11/12 0537) BP: (110-128)/(75-87) 110/75 (11/12 0537) SpO2:  [99 %-100 %] 100 % (11/12 0537) Last BM Date : 03/30/23  Intake/Output from previous day: 11/11 0701 - 11/12 0700 In: 2262.9 [P.O.:700; I.V.:803.9; IV Piggyback:278.9] Out: 470 [Urine:400; Drains:70] Intake/Output this shift: Total I/O In: 480 [P.O.:480] Out: 250 [Urine:250]  PE: General: pleasant, WD, obese female, NAD Lungs: Respiratory effort nonlabored Abd: soft, appropriately ttp, incisions CDI, drain with SS fluid Psych: A&Ox3 with an appropriate affect.    Lab Results:  Recent Labs    03/31/23 0805 04/01/23 0441  WBC 12.8* 9.1  HGB 10.7* 10.1*  HCT 33.0* 31.9*  PLT 365 332   BMET Recent Labs    03/31/23 0805 04/01/23 0441  NA 138 137  K 3.6 2.7*  CL 101 101  CO2 26 26  GLUCOSE 168* 145*  BUN 8 8  CREATININE 0.72 0.72  CALCIUM 8.2* 8.0*   PT/INR No results for input(s): "LABPROT", "INR" in the last 72 hours. CMP     Component Value Date/Time   NA 137 04/01/2023 0441   K 2.7 (LL) 04/01/2023 0441   CL 101 04/01/2023 0441   CO2 26 04/01/2023 0441   GLUCOSE 145 (H) 04/01/2023 0441   BUN 8 04/01/2023 0441   CREATININE 0.72 04/01/2023 0441   CALCIUM 8.0 (L) 04/01/2023 0441   PROT 8.7 (H) 03/30/2023 1119   ALBUMIN 2.9 (L) 03/30/2023 1119   AST 29 03/30/2023 1119   ALT 30 03/30/2023 1119   ALKPHOS 54 03/30/2023 1119   BILITOT 0.8 03/30/2023 1119   GFRNONAA >60 04/01/2023 0441   GFRAA >60 06/29/2016 0821   Lipase     Component Value Date/Time   LIPASE 31 03/30/2023 1119        Studies/Results: CT ABDOMEN PELVIS W CONTRAST  Result Date: 03/30/2023 CLINICAL DATA:  Acute right lower quadrant abdominal pain. EXAM: CT ABDOMEN AND PELVIS WITH CONTRAST TECHNIQUE: Multidetector CT imaging of the abdomen and pelvis was performed using the standard protocol following bolus administration of intravenous contrast. RADIATION DOSE REDUCTION: This exam was performed according to the departmental dose-optimization program which includes automated exposure control, adjustment of the mA and/or kV according to patient size and/or use of iterative reconstruction technique. CONTRAST:  OMNIPAQUE IOHEXOL 300 MG/ML  SOLN COMPARISON:  June 29, 2016. FINDINGS: Lower chest: No acute abnormality. Hepatobiliary: No focal liver abnormality is seen. No gallstones, gallbladder wall thickening, or biliary dilatation. Pancreas: Unremarkable. No pancreatic ductal dilatation or surrounding inflammatory changes. Spleen: Normal in size without focal abnormality. Adrenals/Urinary Tract: Adrenal glands are unremarkable. Kidneys are normal, without renal calculi, focal lesion, or hydronephrosis. Bladder is unremarkable. Stomach/Bowel: The stomach is unremarkable. There is no evidence of bowel obstruction. The appendix is enlarged and inflamed, consistent with appendicitis. It has a maximum measured thickness of 9 mm. There is also noted 4.4 x 4.2 cm air and gas collection adjacent to the distal portion of the appendix concerning for periappendiceal abscess and perforation. There is also noted wall thickening of the cecum most likely representing inflammation secondary  to the appendicitis. Vascular/Lymphatic: No significant vascular findings are present. No enlarged abdominal or pelvic lymph nodes. Reproductive: Status post hysterectomy. No adnexal masses. Other: No hernia is noted. Musculoskeletal: No acute or significant osseous findings. IMPRESSION: Findings consistent with acute appendicitis. 4.4 x  4.2 cm probable periappendiceal abscess is noted consistent with perforation. Wall thickening of the adjacent cecum is noted most likely representing inflammation secondary to appendicitis. Critical Value/emergent results were called by telephone at the time of interpretation on 03/30/2023 at 1:29 pm to provider [facility] , who verbally acknowledged these results. Electronically Signed   By: Lupita Raider M.D.   On: 03/30/2023 13:30    Anti-infectives: Anti-infectives (From admission, onward)    Start     Dose/Rate Route Frequency Ordered Stop   03/30/23 2000  piperacillin-tazobactam (ZOSYN) IVPB 3.375 g        3.375 g 12.5 mL/hr over 240 Minutes Intravenous Every 8 hours 03/30/23 1809 04/04/23 1959   03/30/23 1900  piperacillin-tazobactam (ZOSYN) IVPB 3.375 g  Status:  Discontinued        3.375 g 12.5 mL/hr over 240 Minutes Intravenous Every 8 hours 03/30/23 1809 03/30/23 1818   03/30/23 1345  piperacillin-tazobactam (ZOSYN) IVPB 3.375 g        3.375 g 100 mL/hr over 30 Minutes Intravenous  Once 03/30/23 1330 03/30/23 1415        Assessment/Plan  Perforated appendicitis   POD2 s/p laparoscopic appendectomy   - tolerating reg diet - drain SS fluid - likely DC home with drain and will schedule RN visit next week for removal  - mobilize Hypokalemia - K 2.7 this AM, replace PO and IV, recheck this afternoon   FEN: reg diet, replace K  VTE: LMWH ID: [email or web address]> Augmentin   Likely DC this afternoon if patient feeling well and potassium better   preHTN preDM Morbid obesity - BMI 50   LOS: 2 days     Juliet Rude, [facility] 04/01/2023, 10:15 AM Please see Amion for pager number during day hours 7:00am-4:30pm

## 2023-04-01 NOTE — Anesthesia Postprocedure Evaluation (Signed)
Anesthesia Post Note  Patient: Amy [name]  Procedure(s) Performed: APPENDECTOMY LAPAROSCOPIC (Abdomen)     Patient location during evaluation: PACU Anesthesia Type: General Level of consciousness: awake and alert Pain management: pain level controlled Vital Signs Assessment: post-procedure vital signs reviewed and stable Respiratory status: spontaneous breathing, nonlabored ventilation, respiratory function stable and patient connected to nasal cannula oxygen Cardiovascular status: blood pressure returned to baseline and stable Postop Assessment: no apparent nausea or vomiting Anesthetic complications: no   No notable events documented.  Last Vitals:    Last Pain:                   Collene Schlichter

## 2023-04-01 NOTE — Progress Notes (Signed)
   04/01/23 0846  TOC Brief Assessment  Insurance and Status Reviewed  Patient has primary care physician Yes  Home environment has been reviewed Resides with children  Prior level of function: Independent at baseline  Prior/Current Home Services No current home services  Social Determinants of Health Reivew SDOH reviewed no interventions necessary  Readmission risk has been reviewed Yes  Transition of care needs no transition of care needs at this time

## 2023-04-02 LAB — SURGICAL PATHOLOGY
